# Patient Record
Sex: Female | Born: 1985 | Race: White | Hispanic: No | Marital: Married | Smoking: Never smoker
Health system: Southern US, Community
[De-identification: ages and names within clinical notes are randomized; demographics above are authoritative.]

## PROBLEM LIST (undated history)

## (undated) DIAGNOSIS — M1990 Unspecified osteoarthritis, unspecified site: Secondary | ICD-10-CM

## (undated) HISTORY — PX: APPENDECTOMY: SHX54

## (undated) HISTORY — PX: VULVECTOMY: SHX1086

## (undated) HISTORY — PX: BACK SURGERY: SHX140

## (undated) HISTORY — PX: CHOLECYSTECTOMY: SHX55

---

## 2009-04-04 ENCOUNTER — Emergency Department (HOSPITAL_BASED_OUTPATIENT_CLINIC_OR_DEPARTMENT_OTHER): Admission: EM | Admit: 2009-04-04 | Discharge: 2009-04-04 | Payer: Self-pay | Admitting: Emergency Medicine

## 2009-04-04 ENCOUNTER — Ambulatory Visit: Payer: Self-pay | Admitting: Radiology

## 2009-04-15 ENCOUNTER — Encounter: Admission: RE | Admit: 2009-04-15 | Discharge: 2009-04-15 | Payer: Self-pay | Admitting: Orthopaedic Surgery

## 2009-04-16 ENCOUNTER — Ambulatory Visit: Payer: Self-pay | Admitting: Diagnostic Radiology

## 2009-04-16 ENCOUNTER — Emergency Department (HOSPITAL_BASED_OUTPATIENT_CLINIC_OR_DEPARTMENT_OTHER): Admission: EM | Admit: 2009-04-16 | Discharge: 2009-04-16 | Payer: Self-pay | Admitting: Emergency Medicine

## 2009-04-27 ENCOUNTER — Emergency Department (HOSPITAL_BASED_OUTPATIENT_CLINIC_OR_DEPARTMENT_OTHER): Admission: EM | Admit: 2009-04-27 | Discharge: 2009-04-27 | Payer: Self-pay | Admitting: Emergency Medicine

## 2009-05-26 ENCOUNTER — Emergency Department (HOSPITAL_BASED_OUTPATIENT_CLINIC_OR_DEPARTMENT_OTHER): Admission: EM | Admit: 2009-05-26 | Discharge: 2009-05-27 | Payer: Self-pay | Admitting: Emergency Medicine

## 2009-07-15 ENCOUNTER — Encounter (INDEPENDENT_AMBULATORY_CARE_PROVIDER_SITE_OTHER): Payer: Self-pay | Admitting: Orthopedic Surgery

## 2009-07-15 ENCOUNTER — Inpatient Hospital Stay (HOSPITAL_COMMUNITY): Admission: RE | Admit: 2009-07-15 | Discharge: 2009-07-19 | Payer: Self-pay | Admitting: Orthopedic Surgery

## 2009-07-15 ENCOUNTER — Ambulatory Visit: Payer: Self-pay | Admitting: Vascular Surgery

## 2010-05-26 LAB — COMPREHENSIVE METABOLIC PANEL
Alkaline Phosphatase: 60 U/L (ref 39–117)
BUN: 10 mg/dL (ref 6–23)
Chloride: 107 mEq/L (ref 96–112)
Glucose, Bld: 53 mg/dL — ABNORMAL LOW (ref 70–99)
Potassium: 3.3 mEq/L — ABNORMAL LOW (ref 3.5–5.1)
Total Bilirubin: 0.5 mg/dL (ref 0.3–1.2)

## 2010-05-26 LAB — BASIC METABOLIC PANEL
BUN: 2 mg/dL — ABNORMAL LOW (ref 6–23)
BUN: 3 mg/dL — ABNORMAL LOW (ref 6–23)
CO2: 28 mEq/L (ref 19–32)
CO2: 28 mEq/L (ref 19–32)
CO2: 29 mEq/L (ref 19–32)
Calcium: 7.6 mg/dL — ABNORMAL LOW (ref 8.4–10.5)
Calcium: 7.7 mg/dL — ABNORMAL LOW (ref 8.4–10.5)
Chloride: 106 mEq/L (ref 96–112)
Creatinine, Ser: 0.45 mg/dL (ref 0.4–1.2)
GFR calc Af Amer: >60 mL/min (ref >60)
Glucose, Bld: 91 mg/dL (ref 70–99)
Potassium: 3.2 mEq/L — ABNORMAL LOW (ref 3.5–5.1)
Potassium: 3.3 mEq/L — ABNORMAL LOW (ref 3.5–5.1)
Sodium: 141 mEq/L (ref 135–145)

## 2010-05-26 LAB — URINALYSIS, ROUTINE W REFLEX MICROSCOPIC
Glucose, UA: NEGATIVE mg/dL
Ketones, ur: NEGATIVE mg/dL
Protein, ur: NEGATIVE mg/dL

## 2010-05-26 LAB — CBC
HCT: 25.2 % — ABNORMAL LOW (ref 36.0–46.0)
HCT: 28.5 % — ABNORMAL LOW (ref 36.0–46.0)
HCT: 40.9 % (ref 36.0–46.0)
Hemoglobin: 14.6 g/dL (ref 12.0–15.0)
Hemoglobin: 8.8 g/dL — ABNORMAL LOW (ref 12.0–15.0)
MCHC: 34.8 g/dL (ref 30.0–36.0)
MCHC: 34.8 g/dL (ref 30.0–36.0)
MCHC: 35.1 g/dL (ref 30.0–36.0)
MCV: 93.1 fL (ref 78.0–100.0)
MCV: 93.9 fL (ref 78.0–100.0)
Platelets: 148 10*3/uL — ABNORMAL LOW (ref 150–400)
Platelets: 150 10*3/uL (ref 150–400)
RBC: 3.21 MIL/uL — ABNORMAL LOW (ref 3.87–5.11)
RDW: 12.2 % (ref 11.5–15.5)
RDW: 12.3 % (ref 11.5–15.5)
RDW: 12.4 % (ref 11.5–15.5)
WBC: 13.6 10*3/uL — ABNORMAL HIGH (ref 4.0–10.5)
WBC: 8.1 10*3/uL (ref 4.0–10.5)

## 2010-05-26 LAB — TYPE AND SCREEN
ABO/RH(D): O NEG
Antibody Screen: NEGATIVE

## 2010-05-26 LAB — PROTIME-INR
INR: 1.04 (ref 0.00–1.49)
INR: 1.23 (ref 0.00–1.49)
Prothrombin Time: 13.5 seconds (ref 11.6–15.2)
Prothrombin Time: 15.4 seconds — ABNORMAL HIGH (ref 11.6–15.2)

## 2010-05-26 LAB — URINE MICROSCOPIC-ADD ON

## 2010-05-26 LAB — DIFFERENTIAL
Basophils Absolute: 0 10*3/uL (ref 0.0–0.1)
Basophils Relative: 1 % (ref 0–1)
Neutro Abs: 4.9 10*3/uL (ref 1.7–7.7)
Neutrophils Relative %: 61 % (ref 43–77)

## 2010-05-26 LAB — HCG, SERUM, QUALITATIVE: Preg, Serum: NEGATIVE

## 2010-05-28 LAB — CBC
HCT: 38 % (ref 36.0–46.0)
Hemoglobin: 13.5 g/dL (ref 12.0–15.0)
MCHC: 35.5 g/dL (ref 30.0–36.0)
MCV: 93.2 fL (ref 78.0–100.0)
RDW: 11.3 % — ABNORMAL LOW (ref 11.5–15.5)

## 2010-05-28 LAB — DIFFERENTIAL
Basophils Absolute: 0.2 10*3/uL — ABNORMAL HIGH (ref 0.0–0.1)
Basophils Relative: 1 % (ref 0–1)
Eosinophils Absolute: 0.1 10*3/uL (ref 0.0–0.7)
Eosinophils Relative: 1 % (ref 0–5)
Lymphocytes Relative: 41 % (ref 12–46)
Monocytes Absolute: 1 10*3/uL (ref 0.1–1.0)

## 2010-05-28 LAB — BASIC METABOLIC PANEL
CO2: 26 mEq/L (ref 19–32)
Chloride: 106 mEq/L (ref 96–112)
Glucose, Bld: 103 mg/dL — ABNORMAL HIGH (ref 70–99)
Potassium: 2.6 mEq/L — CL (ref 3.5–5.1)
Sodium: 144 mEq/L (ref 135–145)

## 2010-06-01 LAB — URINALYSIS, ROUTINE W REFLEX MICROSCOPIC
Hgb urine dipstick: NEGATIVE
Specific Gravity, Urine: 1.029 (ref 1.005–1.030)
Urobilinogen, UA: 0.2 mg/dL (ref 0.0–1.0)

## 2012-09-19 ENCOUNTER — Encounter (HOSPITAL_BASED_OUTPATIENT_CLINIC_OR_DEPARTMENT_OTHER): Payer: Self-pay

## 2012-09-19 ENCOUNTER — Emergency Department (HOSPITAL_BASED_OUTPATIENT_CLINIC_OR_DEPARTMENT_OTHER)
Admission: EM | Admit: 2012-09-19 | Discharge: 2012-09-19 | Disposition: A | Discharge status: Home / Self Care | Payer: Medicaid Other | Attending: Emergency Medicine | Admitting: Emergency Medicine

## 2012-09-19 ENCOUNTER — Emergency Department (HOSPITAL_BASED_OUTPATIENT_CLINIC_OR_DEPARTMENT_OTHER): Payer: Medicaid Other

## 2012-09-19 DIAGNOSIS — Z3202 Encounter for pregnancy test, result negative: Secondary | ICD-10-CM | POA: Insufficient documentation

## 2012-09-19 DIAGNOSIS — Y939 Activity, unspecified: Secondary | ICD-10-CM | POA: Insufficient documentation

## 2012-09-19 DIAGNOSIS — Z9089 Acquired absence of other organs: Secondary | ICD-10-CM | POA: Insufficient documentation

## 2012-09-19 DIAGNOSIS — Y929 Unspecified place or not applicable: Secondary | ICD-10-CM | POA: Insufficient documentation

## 2012-09-19 DIAGNOSIS — Z79899 Other long term (current) drug therapy: Secondary | ICD-10-CM | POA: Insufficient documentation

## 2012-09-19 DIAGNOSIS — Z87442 Personal history of urinary calculi: Secondary | ICD-10-CM | POA: Insufficient documentation

## 2012-09-19 DIAGNOSIS — T148XXA Other injury of unspecified body region, initial encounter: Secondary | ICD-10-CM

## 2012-09-19 DIAGNOSIS — Z8739 Personal history of other diseases of the musculoskeletal system and connective tissue: Secondary | ICD-10-CM | POA: Insufficient documentation

## 2012-09-19 DIAGNOSIS — S335XXA Sprain of ligaments of lumbar spine, initial encounter: Secondary | ICD-10-CM | POA: Insufficient documentation

## 2012-09-19 DIAGNOSIS — Z9889 Other specified postprocedural states: Secondary | ICD-10-CM | POA: Insufficient documentation

## 2012-09-19 DIAGNOSIS — X58XXXA Exposure to other specified factors, initial encounter: Secondary | ICD-10-CM | POA: Insufficient documentation

## 2012-09-19 HISTORY — DX: Unspecified osteoarthritis, unspecified site: M19.90

## 2012-09-19 LAB — URINALYSIS, ROUTINE W REFLEX MICROSCOPIC
Glucose, UA: NEGATIVE mg/dL
Hgb urine dipstick: NEGATIVE
Protein, ur: NEGATIVE mg/dL

## 2012-09-19 LAB — PREGNANCY, URINE: Preg Test, Ur: NEGATIVE

## 2012-09-19 MED ORDER — CYCLOBENZAPRINE HCL 10 MG PO TABS
10.0000 mg | ORAL_TABLET | Freq: Once | ORAL | Status: AC
  Administered 2012-09-19: 10 mg via ORAL
  Filled 2012-09-19: qty 1

## 2012-09-19 MED ORDER — OXYCODONE-ACETAMINOPHEN 5-325 MG PO TABS
1.0000 | ORAL_TABLET | Freq: Once | ORAL | Status: AC
  Administered 2012-09-19: 1 via ORAL
  Filled 2012-09-19 (×2): qty 1

## 2012-09-19 MED ORDER — HYDROCODONE-ACETAMINOPHEN 5-325 MG PO TABS
1.0000 | ORAL_TABLET | Freq: Once | ORAL | Status: AC
  Administered 2012-09-19: 1 via ORAL
  Filled 2012-09-19: qty 1

## 2012-09-19 MED ORDER — OXYCODONE-ACETAMINOPHEN 5-325 MG PO TABS: 1.0000 | ORAL_TABLET | Freq: Four times a day (QID) | ORAL | Status: DC | PRN

## 2012-09-19 MED ORDER — CYCLOBENZAPRINE HCL 10 MG PO TABS: 10.0000 mg | ORAL_TABLET | Freq: Two times a day (BID) | ORAL | Status: AC | PRN

## 2012-09-19 NOTE — ED Provider Notes (Signed)
History    CSN: [id number] Arrival date & time 09/19/12  2051  First MD Initiated Contact with Patient 09/19/12 2125     Chief Complaint  Patient presents with  . Flank Pain   (Consider location/radiation/quality/duration/timing/severity/associated sxs/prior Treatment) Patient is a 27 y.o. female presenting with flank pain. The history is provided by the patient.  Flank Pain This is a new problem. Episode onset: 3 days ago. The problem occurs hourly. The problem has not changed since onset.Pertinent negatives include no chest pain, no abdominal pain, no headaches and no shortness of breath. The symptoms are aggravated by bending and twisting (Seems to be worse at the end of the day). The symptoms are relieved by rest. She has tried a warm compress for the symptoms. The treatment provided mild relief.   Past Medical History  Diagnosis Date  . DJD (degenerative joint disease)    Past Surgical History  Procedure Laterality Date  . Appendectomy    . Cholecystectomy    . Back surgery    . Cesarean section     No family history on file. History  Substance Use Topics  . Smoking status: Never Smoker   . Smokeless tobacco: Not on file  . Alcohol Use: No   OB History   Grav Para Term Preterm Abortions TAB SAB Ect Mult Living                 Review of Systems  Respiratory: Negative for shortness of breath.   Cardiovascular: Negative for chest pain.  Gastrointestinal: Negative for abdominal pain.  Genitourinary: Positive for flank pain. Negative for dysuria, urgency, hematuria, vaginal bleeding, vaginal discharge and vaginal pain.  Neurological: Negative for headaches.  All other systems reviewed and are negative.    Allergies  Demerol and Morphine and related  Home Medications   Current Outpatient Rx  Name  Route  Sig  Dispense  Refill  . hydrOXYzine (VISTARIL) 50 MG capsule   Oral   Take 50 mg by mouth 3 (three) times daily as needed for itching.         .  lamoTRIgine (LAMICTAL) 200 MG tablet   Oral   Take 200 mg by mouth daily.         Marland Kitchen lurasidone (LATUDA) 80 MG TABS   Oral   Take by mouth daily with breakfast.         . cyclobenzaprine (FLEXERIL) 10 MG tablet   Oral   Take 1 tablet (10 mg total) by mouth 2 (two) times daily as needed for muscle spasms.   20 tablet   0   . oxyCODONE-acetaminophen (PERCOCET/ROXICET) 5-325 MG per tablet   Oral   Take 1 tablet by mouth every 6 (six) hours as needed for pain.   20 tablet   0    BP 109/65  Pulse 78  Temp(Src) 98.1 F (36.7 C) (Oral)  Resp 20  Ht 5\' 5"  (1.651 m)  Wt 174 lb (78.926 kg)  BMI 28.96 kg/m2  SpO2 100%  LMP 08/29/2012 Physical Exam  Nursing note and vitals reviewed. Constitutional: She is oriented to person, place, and time. She appears well-developed and well-nourished. No distress.  HENT:  Head: Normocephalic and atraumatic.  Eyes: EOM are normal. Pupils are equal, round, and reactive to light.  Cardiovascular: Normal rate, regular rhythm, normal heart sounds and intact distal pulses.  Exam reveals no friction rub.   No murmur heard. Pulmonary/Chest: Effort normal and breath sounds normal. She has no wheezes.  She has no rales.  Abdominal: Soft. Bowel sounds are normal. She exhibits no distension. There is no tenderness. There is no rebound and no guarding.  Musculoskeletal: Normal range of motion. She exhibits no tenderness.       Lumbar back: She exhibits pain and spasm. She exhibits normal pulse.       Back:  No edema  Neurological: She is alert and oriented to person, place, and time. No cranial nerve deficit.  Skin: Skin is warm and dry. No rash noted.  Psychiatric: She has a normal mood and affect. Her behavior is normal.    ED Course  Procedures (including critical care time) Labs Reviewed  URINALYSIS, ROUTINE W REFLEX MICROSCOPIC  PREGNANCY, URINE   Dg Lumbar Spine Complete  09/19/2012   *RADIOLOGY REPORT*  Clinical Data: Right low back pain,  prior surgery  LUMBAR SPINE - COMPLETE 4+ VIEW  Comparison: 07/15/2009  Findings: Postop changes at L4-5 and L5 S1.  Normal alignment. Preserved vertebral body heights.  No definite hardware abnormality.  Prior cholecystectomy evident.  Nonobstructive bowel gas pattern.  IMPRESSION: Postop changes L4-5 and L5 S1.  No acute process by plain radiography   Original Report Authenticated By: Judie Petit. Shick, M.D.   1. Musculoskeletal strain     MDM   Patient with right-sided back pain and symptoms are most suggestive of spasm. She has a history of spinal fusion several years ago after degenerative disc disease. She denies any urinary symptoms and denies any vaginal symptoms. Review a is completely normal and UPT is negative. She does have a history of kidney stones but states this feels different. Also with several days of pain and no blood in the urine a low suspicion for kidney stone at this time. On exam there is a palpable muscle spasm which is tender. Lumbar films are negative for displaced hardware or acute process. Patient is feeling better after pain medication and muscle relaxers. Patient recommended following up with specialist if symptoms persist. She was given prescriptions to help with pain and spasm.  Gwyneth Sprout, MD 09/19/12 [phone]

## 2012-09-19 NOTE — ED Notes (Signed)
Right side flank pain x 3 days-denies n/v/d,dysuria-positive urinary freq

## 2013-04-01 ENCOUNTER — Encounter (HOSPITAL_BASED_OUTPATIENT_CLINIC_OR_DEPARTMENT_OTHER): Payer: Self-pay | Admitting: Emergency Medicine

## 2013-04-01 ENCOUNTER — Emergency Department (HOSPITAL_BASED_OUTPATIENT_CLINIC_OR_DEPARTMENT_OTHER)
Admission: EM | Admit: 2013-04-01 | Discharge: 2013-04-02 | Disposition: A | Discharge status: Home / Self Care | Payer: Medicaid Other | Attending: Emergency Medicine | Admitting: Emergency Medicine

## 2013-04-01 DIAGNOSIS — H6690 Otitis media, unspecified, unspecified ear: Secondary | ICD-10-CM | POA: Insufficient documentation

## 2013-04-01 DIAGNOSIS — Z8739 Personal history of other diseases of the musculoskeletal system and connective tissue: Secondary | ICD-10-CM | POA: Insufficient documentation

## 2013-04-01 DIAGNOSIS — R6884 Jaw pain: Secondary | ICD-10-CM

## 2013-04-01 DIAGNOSIS — Z79899 Other long term (current) drug therapy: Secondary | ICD-10-CM | POA: Insufficient documentation

## 2013-04-01 DIAGNOSIS — R599 Enlarged lymph nodes, unspecified: Secondary | ICD-10-CM | POA: Insufficient documentation

## 2013-04-01 DIAGNOSIS — K029 Dental caries, unspecified: Secondary | ICD-10-CM | POA: Insufficient documentation

## 2013-04-01 MED ORDER — CLINDAMYCIN HCL 300 MG PO CAPS: 300.0000 mg | ORAL_CAPSULE | Freq: Four times a day (QID) | ORAL | Status: AC

## 2013-04-01 MED ORDER — OXYCODONE-ACETAMINOPHEN 5-325 MG PO TABS: 2.0000 | ORAL_TABLET | ORAL | Status: AC | PRN

## 2013-04-01 MED ORDER — OXYCODONE-ACETAMINOPHEN 5-325 MG PO TABS
2.0000 | ORAL_TABLET | Freq: Once | ORAL | Status: AC
  Administered 2013-04-02: 2 via ORAL
  Filled 2013-04-01: qty 2

## 2013-04-01 NOTE — Discharge Instructions (Signed)
Clindamycin as prescribed.  Percocet as prescribed as needed for pain.  If you're not improving in the next several days, you should followup with a dentist for further evaluation.    Emergency Department Resource Guide 1) Find a Doctor and Pay Out of Pocket Although you won't have to find out who is covered by your insurance plan, it is a good idea to ask around and get recommendations. You will then need to call the office and see if the doctor you have chosen will accept you as a new patient and what types of options they offer for patients who are self-pay. Some doctors offer discounts or will set up payment plans for their patients who do not have insurance, but you will need to ask so you aren't surprised when you get to your appointment.  2) Contact Your Local Health Department Not all health departments have doctors that can see patients for sick visits, but many do, so it is worth a call to see if yours does. If you don't know where your local health department is, you can check in your phone book. The CDC also has a tool to help you locate your state's health department, and many state websites also have listings of all of their local health departments.  3) Find a Walk-in Clinic If your illness is not likely to be very severe or complicated, you may want to try a walk in clinic. These are popping up all over the country in pharmacies, drugstores, and shopping centers. They're usually staffed by nurse practitioners or physician assistants that have been trained to treat common illnesses and complaints. They're usually fairly quick and inexpensive. However, if you have serious medical issues or chronic medical problems, these are probably not your best option.  No Primary Care Doctor: - Call Health Connect at  [phone] - they can help you locate a primary care doctor that  accepts your insurance, provides certain services, etc. - Physician Referral Service- [phone]  Chronic Pain  Problems: Organization         Address  Phone   Notes  [facility]  [phone] Patients need to be referred by their primary care doctor.   Medication Assistance: Organization         Address  Phone   Notes  [facility] [address] [place], [place] [address] [phone] --Must be a resident of [facility] [place] -- Must have NO insurance coverage whatsoever (no Medicaid/ Medicare, etc.) -- The pt. MUST have a primary care doctor that directs their care regularly and follows them in the community   Med[facility]ist  [phone]   [employer]  [phone]    Agencies that provide inexpensive medical care: Organization         Address  Phone   Notes  [facility]  [phone]   [facility]    [phone]   [facility] [address] [place], [place] [address] [phone]) [phone]   [facility] [place] [address], [place] [phone]   [facility]anned [facility]    [phone]   [facility]    [phone]   [facility]  [address],  Phone:  [phone], Fax:  [phone] Hours of Operation:  9 am - 6 pm, M-F.  Also accepts Medicaid/Medicare and self-pay.  [facility]  [address], [place] Phone: [phone], Fax: [phone]. Hours of Operation:  8:30 am - 5:30 pm, M-F.  Also accepts Medicaid and self-pay.  °[facility] [place] [address], [place] Phone: [phone]   °[facility] [address], [place], [place] [phone], Ext. 123 Mondays & Thursdays: 7-9 AM.  First 15 patients are seen on a first come, first serve basis. °  ° °Medicaid-accepting [place] Providers: ° °Organization         Address  Phone   Notes  °[facility] [address], [place] [phone] Also  accepts self-pay patients.  °[facility] [address], [place] ° [phone]   °[facility] [address], [place] [phone]   °[facility] [address] [place] [address], [place] [phone]   °Veita Bland [address], [place]  ° [phone] Only accepts [place] Access Medicaid patients after they have their name applied to their card.  ° °Self-Pay (no insurance) in [place]: ° °Organization         Address  Phone   Notes  °Sickle Cell Patients, [facility] [address], [place] [phone]   °[facility] [address], [place] [phone]   °[facility] ° [address], [place] [phone]   °[facility]/Dr. Osei-Bonsu ° [address] [place] [address], [place] or [address], [place] [phone] Phone number for both [place] and [place] locations is the same.  °[facility] [address], [place] [phone]   °[facility] [address] [place] [address], [place] or [address] [phone] °([phone]) [phone]   °[facility] [address], [place] [phone]) [phone], phone; [phone], fax Sees patients 1st and 3rd Saturday of every month.  Must not qualify for public or private insurance (i.e. Medicaid, Medicare, [place] Health Choice, Veterans' Benefits) • Household income should be no more than 200% of the poverty level •The clinic cannot treat you if you are pregnant or think you are pregnant • Sexually transmitted diseases are not treated at the clinic.  ° ° °Dental Care: °Organization         Address  Phone  Notes  °[place] [facility] [address], [place] [phone]) [phone] Accepts children up to age 21 who are enrolled in Medicaid or [place] Health Choice; pregnant  women with a Medicaid card; and children who have applied for Medicaid or [place] Health Choice, but were declined, whose parents can pay a reduced fee at time of service.  °[place] [facility] [place]  [address], [place] [phone]) [phone] Accepts children up to age 21 who are enrolled in Medicaid or [place] Health Choice; pregnant women with a Medicaid card; and children who have applied for Medicaid or [place] Health Choice, but were declined, whose parents can pay a reduced fee at time of service.  °[facility] ° [address], [place] [phone]) [phone] Patients are seen by appointment only. Walk-ins are not accepted. [facility] will see patients 18 years of age and older. °Monday - Tuesday (8am-5pm) °Most Wednesdays (8:30-5pm) °$30 per visit, cash only  °[facility]   [facility]  [address], [facility] [phone] Patients are seen by appointment only. Walk-ins are not accepted. [place] will see patients 37 years of age and older. One Wednesday Evening (Monthly: Volunteer Based).  $30 per visit, cash only  [place]  [phone] for adults; Children under age 77, call Graduate Pediatric Dentistry at [phone]. Children aged 79-14, please call [phone] to request a pediatric application.  Dental services are provided in all areas of dental care including fillings, crowns and bridges, complete and partial dentures, implants, gum treatment, root canals, and extractions. Preventive care is also provided. Treatment is provided to both adults and children. Patients are selected via a lottery and there is often a waiting list.   [facility] [address], [place]  [phone] [email or web address]   [facility] [address] [place], [place] [phone], Ext. [phone] Second and Fourth Thursday of each month, opens at 6:30 AM; Clinic ends at 9 AM.  Patients are  seen on a first-come first-served basis, and a limited number are seen during each clinic.   [facility]  [address] Hillard Danker [place], [place] [phone]   Eligibility Requirements You must have lived in [place], [place], or [place] counties for at least the last three months.   You cannot be eligible for state or federal sponsored [employer], including [employer], [place], or [employer].   You generally cannot be eligible for healthcare insurance through your employer.    How to apply: Eligibility screenings are held every Tuesday and Wednesday afternoon from 1:00 pm until 4:00 pm. You do not need an appointment for the interview!  [facility] [address], [place], [place]   [place] Department  [place]  [phone]    Behavioral Health Resources in the Community: Intensive Outpatient Programs Organization         Address  Phone  Notes  [place]. [address] [place], [place] [phone]   [facility] [address], [place], [place]   [facility] [address] [place], [place]   [place] [address],  [place], [place] or [phone]   Substance Abuse Resources Organization         Address  Phone  Notes  [facility] Drug Services  [phone]   [employer]  [phone]   [facility] [place]   Chinita Pester  [phone]   Residential & Outpatient Substance Abuse Program  [phone]   Psychological Services Organization         Address  Phone  Notes  [facility] [place]  [place]  [phone]   [place] [address] [place] [phone] or [phone]    Mobile Crisis  Teams Organization         Address  Phone  Notes  [facility], Mobile Crisis Care Unit  [phone]   [facility]  [address] [place], [place]   [place] [address], [place] [phone]    Self-Help/Support Groups Organization         Address  Phone             Notes  Mental Health  [place] - variety of support groups  336- H3156881 Call for more information  Narcotics Anonymous (NA), [facility] [address], [employer] [place]  2 meetings at this location   Special educational needs teacher         Address  Phone  Notes  [facility] [place],    [place]  [phone])[phone]   [facility]  [address], [place] [address]1157, [place], [place]   [place], [place] [phone] Admissions: 8am-3pm M-F  Incentives Substance [place] [address],    [place], [place] [phone]   [facility] [address], [place], [place]   [facility] [address],  [place], [place]   [facility] - Intensive Outpatient [address], [place], [place]   [facility] ([place]  [place], [place] [phone]-(820)[phone] or [phone]   [facility] ([facility]) [address] [place], [place] Accepts Medicaid  [facility] [address],  [place] [phone] Substance Abuse/Addiction Treatment   [facility] Organization         Address  Phone  Notes  [facility]  [phone]   Domenic Schwab, PhD [address] [place], [place]   [phone] or 7720305616   [place], [place] [phone]   [facility] [address], [place], [place] [phone]  Insurance/Medicaid/sponsorship through [facility] and Families [address], [address] [place]                                    [place], [place] [phone] [employer] [address], [place] [phone]    Dr. Adele Schilder  [phone]   [facility] Dept. 1) [address] [place] 2) [place] 3)  [place] [address], [place] [phone]  [phone]   [place] [phone] or [phone] (After Hours)

## 2013-04-01 NOTE — ED Provider Notes (Signed)
CSN: [id number]     Arrival date & time 04/01/13  2318 History   First MD Initiated Contact with Patient 04/01/13 2329     Chief Complaint  Patient presents with  . Otitis Media   (Consider location/radiation/quality/duration/timing/severity/associated sxs/prior Treatment) HPI Comments: Patient is a 28 year old female who presents with complaints of left jaw pain. She states she was treated for an otitis media 2 weeks ago with amoxicillin however did not improve. She feels like the lymph nodes in her neck are swollen. She reports severe pain with palpation. Her pain is also exacerbated by eating and chewing.  The history is provided by the patient.    Past Medical History  Diagnosis Date  . DJD (degenerative joint disease)    Past Surgical History  Procedure Laterality Date  . Appendectomy    . Cholecystectomy    . Back surgery    . Cesarean section     History reviewed. No pertinent family history. History  Substance Use Topics  . Smoking status: Never Smoker   . Smokeless tobacco: Not on file  . Alcohol Use: No   OB History   Grav Para Term Preterm Abortions TAB SAB Ect Mult Living                 Review of Systems  All other systems reviewed and are negative.    Allergies  Demerol and Morphine and related  Home Medications   Current Outpatient Rx  Name  Route  Sig  Dispense  Refill  . pregabalin (LYRICA) 50 MG capsule   Oral   Take 50 mg by mouth 3 (three) times daily.         . cyclobenzaprine (FLEXERIL) 10 MG tablet   Oral   Take 1 tablet (10 mg total) by mouth 2 (two) times daily as needed for muscle spasms.   20 tablet   0   . hydrOXYzine (VISTARIL) 50 MG capsule   Oral   Take 50 mg by mouth 3 (three) times daily as needed for itching.         . lurasidone (LATUDA) 80 MG TABS   Oral   Take by mouth daily with breakfast.          BP 117/69  Pulse 95  Temp(Src) 97.9 F (36.6 C) (Oral)  Resp 23  Ht 5\' 5"  (1.651 m)  Wt 185 lb (83.915  kg)  BMI 30.79 kg/m2  SpO2 100% Physical Exam  Nursing note and vitals reviewed. Constitutional: She is oriented to person, place, and time. She appears well-developed and well-nourished. No distress.  HENT:  Head: Normocephalic and atraumatic.  There is tenderness to palpation of the left jaw and the left lower molars. There are some caries however no obvious swelling consistent with an abscess.  Neck: Normal range of motion. Neck supple.  Musculoskeletal: Normal range of motion. She exhibits no edema.  Lymphadenopathy:    She has no cervical adenopathy.  Neurological: She is alert and oriented to person, place, and time.  Skin: Skin is warm and dry. She is not diaphoretic.    ED Course  Procedures (including critical care time) Labs Review Labs Reviewed - No data to display Imaging Review No results found.    MDM  No diagnosis found. Her discomfort seems dental in nature. I will prescribe an antibiotic and pain medication. I've advised her to followup with her dentist if not improving in the next several days.    Geoffery Lyonsouglas Shiya Fogelman, MD 04/01/13 [phone]

## 2013-04-01 NOTE — ED Notes (Signed)
Pt reports that she was treated in the ED for an ear infection 2 weeks ago.  Reports that the pain continues.  States that the infection is in her head and mouth now.

## 2013-06-07 ENCOUNTER — Encounter (HOSPITAL_BASED_OUTPATIENT_CLINIC_OR_DEPARTMENT_OTHER): Payer: Self-pay | Admitting: Emergency Medicine

## 2013-06-07 ENCOUNTER — Emergency Department (HOSPITAL_BASED_OUTPATIENT_CLINIC_OR_DEPARTMENT_OTHER): Payer: Medicaid Other

## 2013-06-07 ENCOUNTER — Emergency Department (HOSPITAL_BASED_OUTPATIENT_CLINIC_OR_DEPARTMENT_OTHER)
Admission: EM | Admit: 2013-06-07 | Discharge: 2013-06-07 | Disposition: A | Discharge status: Home / Self Care | Payer: Medicaid Other | Attending: Emergency Medicine | Admitting: Emergency Medicine

## 2013-06-07 DIAGNOSIS — R109 Unspecified abdominal pain: Secondary | ICD-10-CM | POA: Insufficient documentation

## 2013-06-07 DIAGNOSIS — Z79899 Other long term (current) drug therapy: Secondary | ICD-10-CM | POA: Insufficient documentation

## 2013-06-07 DIAGNOSIS — R1030 Lower abdominal pain, unspecified: Secondary | ICD-10-CM

## 2013-06-07 DIAGNOSIS — M1990 Unspecified osteoarthritis, unspecified site: Secondary | ICD-10-CM | POA: Insufficient documentation

## 2013-06-07 DIAGNOSIS — Z3202 Encounter for pregnancy test, result negative: Secondary | ICD-10-CM | POA: Insufficient documentation

## 2013-06-07 DIAGNOSIS — Z792 Long term (current) use of antibiotics: Secondary | ICD-10-CM | POA: Insufficient documentation

## 2013-06-07 DIAGNOSIS — Z9089 Acquired absence of other organs: Secondary | ICD-10-CM | POA: Insufficient documentation

## 2013-06-07 LAB — URINALYSIS, ROUTINE W REFLEX MICROSCOPIC
Bilirubin Urine: NEGATIVE
GLUCOSE, UA: NEGATIVE mg/dL
HGB URINE DIPSTICK: NEGATIVE
KETONES UR: NEGATIVE mg/dL
Leukocytes, UA: NEGATIVE
Nitrite: NEGATIVE
PROTEIN: NEGATIVE mg/dL
Specific Gravity, Urine: 1.025 (ref 1.005–1.030)
UROBILINOGEN UA: 0.2 mg/dL (ref 0.0–1.0)
pH: 6 (ref 5.0–8.0)

## 2013-06-07 LAB — PREGNANCY, URINE: Preg Test, Ur: NEGATIVE

## 2013-06-07 MED ORDER — TRAMADOL HCL 50 MG PO TABS: 50.0000 mg | ORAL_TABLET | Freq: Four times a day (QID) | ORAL | Status: AC | PRN

## 2013-06-07 MED ORDER — OXYCODONE-ACETAMINOPHEN 5-325 MG PO TABS
1.0000 | ORAL_TABLET | Freq: Once | ORAL | Status: AC
  Administered 2013-06-07: 1 via ORAL

## 2013-06-07 MED ORDER — OXYCODONE-ACETAMINOPHEN 5-325 MG PO TABS
ORAL_TABLET | ORAL | Status: AC
  Filled 2013-06-07: qty 1

## 2013-06-07 MED ORDER — NAPROXEN 375 MG PO TABS: 375.0000 mg | ORAL_TABLET | Freq: Two times a day (BID) | ORAL | Status: DC

## 2013-06-07 MED ORDER — IOHEXOL 300 MG/ML  SOLN
100.0000 mL | Freq: Once | INTRAMUSCULAR | Status: AC | PRN
  Administered 2013-06-07: 100 mL via INTRAVENOUS

## 2013-06-07 NOTE — ED Provider Notes (Signed)
CSN: [id number]     Arrival date & time 06/07/13  2005 History  This chart was scribed for Rolan BuccoMelanie Deannah Rossi, MD by Smiley HousemanFallon Davis, ED Scribe. The patient was seen in room MH04/MH04. Patient's care was started at 8:52 PM.    Chief Complaint  Patient presents with  . Vaginal Pain   The history is provided by the patient. No language interpreter was used.   HPI Comments: Whitney Ayers is a 28 y.o. female who presents to the Emergency Department complaining of intermittent external vaginal pain that started about 2 weeks ago.  Pt states that the pain has rapidly worsened over the past two days.  She states she thought she pulled a groin muscle, because she isn't experiencing internal vaginal pain.  Pt states the area is tender to touch.  Pt denies abdominal pain, but states her lower abdomen is sore due to the pain.  Pt's husband states she has dropped to the floor a couple of times due to the pain.  Pt denies any nausea, emesis, vaginal discharge, dysuria, and hematuria.  Pt states she had a vulvectomy in 2007 and thought she had nerve damage.  Pt states she had the vulvectomy because she had excessive tissue.  She describes it as a shooting pain that comes and goes.  Past Medical History  Diagnosis Date  . DJD (degenerative joint disease)    Past Surgical History  Procedure Laterality Date  . Appendectomy    . Cholecystectomy    . Back surgery    . Cesarean section    . Vulvectomy     No family history on file. History  Substance Use Topics  . Smoking status: Never Smoker   . Smokeless tobacco: Not on file  . Alcohol Use: No   OB History   Grav Para Term Preterm Abortions TAB SAB Ect Mult Living                 Review of Systems  Constitutional: Negative for fever and chills.  Gastrointestinal: Negative for nausea, vomiting, abdominal pain and diarrhea.  Genitourinary: Positive for vaginal pain. Negative for dysuria, urgency, hematuria, vaginal bleeding, vaginal discharge and  difficulty urinating.  Musculoskeletal: Negative for back pain.  Skin: Negative for color change and rash.  Neurological: Negative for headaches.  Psychiatric/Behavioral: Negative for behavioral problems and confusion.  All other systems reviewed and are negative.    Allergies  Demerol and Morphine and related  Home Medications   Current Outpatient Rx  Name  Route  Sig  Dispense  Refill  . clindamycin (CLEOCIN) 300 MG capsule   Oral   Take 1 capsule (300 mg total) by mouth 4 (four) times daily. X 7 days   28 capsule   0   . cyclobenzaprine (FLEXERIL) 10 MG tablet   Oral   Take 1 tablet (10 mg total) by mouth 2 (two) times daily as needed for muscle spasms.   20 tablet   0   . hydrOXYzine (VISTARIL) 50 MG capsule   Oral   Take 50 mg by mouth 3 (three) times daily as needed for itching.         . lurasidone (LATUDA) 80 MG TABS   Oral   Take by mouth daily with breakfast.         . naproxen (NAPROSYN) 375 MG tablet   Oral   Take 1 tablet (375 mg total) by mouth 2 (two) times daily.   20 tablet   0   .  oxyCODONE-acetaminophen (PERCOCET) 5-325 MG per tablet   Oral   Take 2 tablets by mouth every 4 (four) hours as needed.   15 tablet   0   . pregabalin (LYRICA) 50 MG capsule   Oral   Take 50 mg by mouth 3 (three) times daily.         . traMADol (ULTRAM) 50 MG tablet   Oral   Take 1 tablet (50 mg total) by mouth every 6 (six) hours as needed.   15 tablet   0    Triage Vitals: BP 113/76  Pulse 75  Temp(Src) 98.5 F (36.9 C) (Oral)  Resp 16  Ht 5\' 5"  (1.651 m)  Wt 180 lb (81.647 kg)  BMI 29.95 kg/m2  SpO2 100%  LMP 05/22/2013  Physical Exam  Nursing note and vitals reviewed. Constitutional: She is oriented to person, place, and time. She appears well-developed and well-nourished. No distress.  HENT:  Head: Normocephalic and atraumatic.  Eyes: Conjunctivae and EOM are normal. Right eye exhibits no discharge. Left eye exhibits no discharge.   Neck: Neck supple. No tracheal deviation present.  Cardiovascular: Normal rate.   Pulmonary/Chest: Effort normal. No respiratory distress.  Abdominal: Soft. She exhibits no distension and no mass. There is no tenderness. There is no rebound and no guarding.  Genitourinary:  +TTP left inguinal area.  No mass/swelling.  No pain to vaginal area/introitus.  No rashes or sores  Musculoskeletal: Normal range of motion.  Neurological: She is alert and oriented to person, place, and time.  Skin: Skin is warm and dry. No rash noted.  Psychiatric: She has a normal mood and affect. Her behavior is normal. Judgment and thought content normal.    ED Course  Procedures (including critical care time) DIAGNOSTIC STUDIES: Oxygen Saturation is 100% on RA, normal by my interpretation.    COORDINATION OF CARE: 9:01 PM-Will complete a pelvic exam. Will order CT of pelvis.  Patient informed of current plan of treatment and evaluation and agrees with plan.    Results for orders placed during the hospital encounter of 06/07/13  URINALYSIS, ROUTINE W REFLEX MICROSCOPIC      Result Value Ref Range   Color, Urine YELLOW  YELLOW   APPearance CLOUDY (*) CLEAR   Specific Gravity, Urine 1.025  1.005 - 1.030   pH 6.0  5.0 - 8.0   Glucose, UA NEGATIVE  NEGATIVE mg/dL   Hgb urine dipstick NEGATIVE  NEGATIVE   Bilirubin Urine NEGATIVE  NEGATIVE   Ketones, ur NEGATIVE  NEGATIVE mg/dL   Protein, ur NEGATIVE  NEGATIVE mg/dL   Urobilinogen, UA 0.2  0.0 - 1.0 mg/dL   Nitrite NEGATIVE  NEGATIVE   Leukocytes, UA NEGATIVE  NEGATIVE  PREGNANCY, URINE      Result Value Ref Range   Preg Test, Ur NEGATIVE  NEGATIVE    Imaging Review Ct Pelvis W Contrast  06/07/2013   CLINICAL DATA:  Left groin pain.  EXAM: CT PELVIS WITH CONTRAST  TECHNIQUE: Multidetector CT imaging of the pelvis was performed using the standard protocol following the bolus administration of intravenous contrast.  CONTRAST:  OMNIPAQUE IOHEXOL 300  MG/ML  SOLN  COMPARISON:  CT of the abdomen and pelvis performed 01/30/2003  FINDINGS: No focal abnormalities are seen to explain the patient's symptoms. The inguinal regions are unremarkable bilaterally. There is no evidence of inguinal lymphadenopathy. There is no evidence of an inguinal hernia. Underlying vasculature is within normal limits.  There is no evidence of fracture  or dislocation. Both hip joints are grossly unremarkable in appearance. No significant degenerative change is seen. The patient is status post lumbar spinal fusion at L4-S1, incompletely imaged on this study, with prominent associated metallic spacers. The sacroiliac joints are unremarkable in appearance.  The uterus is within normal limits. The ovaries are relatively symmetric. No suspicious adnexal masses are seen. The patient is status post appendectomy. The visualized small and large bowel are grossly unremarkable in appearance.  IMPRESSION: Unremarkable CT of the pelvis.   Electronically Signed   By: Roanna Raider M.D.   On: 06/07/2013 22:41    MDM   Final diagnoses:  Groin pain    PT with pain to left groin.  No hernia palpated.  No abd pain/flank pain or hematuria that would be more suggestive of renal colic.  No vaginal symptoms.  Could be nerve irritation/impingement from scar tissue.  Referred to fl/u with ob/gyn.  Discharged with pain meds.  I personally performed the services described in this documentation, which was scribed in my presence.  The recorded information has been reviewed and considered.      Rolan Bucco, MD 06/07/13 [phone]

## 2013-06-07 NOTE — Discharge Instructions (Signed)
Pelvic Pain, Female °Female pelvic pain can be caused by many different things and start from a variety of places. Pelvic pain refers to pain that is located in the lower half of the abdomen and between your hips. The pain may occur over a short period of time (acute) or may be reoccurring (chronic). The cause of pelvic pain may be related to disorders affecting the female reproductive organs (gynecologic), but it may also be related to the bladder, kidney stones, an intestinal complication, or muscle or skeletal problems. Getting help right away for pelvic pain is important, especially if there has been severe, sharp, or a sudden onset of unusual pain. It is also important to get help right away because some types of pelvic pain can be life threatening.  °CAUSES  °Below are only some of the causes of pelvic pain. The causes of pelvic pain can be in one of several categories.  °· Gynecologic. °· Pelvic inflammatory disease. °· Sexually transmitted infection. °· Ovarian cyst or a twisted ovarian ligament (ovarian torsion). °· Uterine lining that grows outside the uterus (endometriosis). °· Fibroids, cysts, or tumors. °· Ovulation. °· Pregnancy. °· Pregnancy that occurs outside the uterus (ectopic pregnancy). °· Miscarriage. °· Labor. °· Abruption of the placenta or ruptured uterus. °· Infection. °· Uterine infection (endometritis). °· Bladder infection. °· Diverticulitis. °· Miscarriage related to a uterine infection (septic abortion). °· Bladder. °· Inflammation of the bladder (cystitis). °· Kidney stone(s). °· Gastrointenstinal. °· Constipation. °· Diverticulitis. °· Neurologic. °· Trauma. °· Feeling pelvic pain because of mental or emotional causes (psychosomatic). °· Cancers of the bowel or pelvis. °EVALUATION  °Your caregiver will want to take a careful history of your concerns. This includes recent changes in your health, a careful gynecologic history of your periods (menses), and a sexual history. Obtaining  your family history and medical history is also important. Your caregiver may suggest a pelvic exam. A pelvic exam will help identify the location and severity of the pain. It also helps in the evaluation of which organ system may be involved. In order to identify the cause of the pelvic pain and be properly treated, your caregiver may order tests. These tests may include:  °· A pregnancy test. °· Pelvic ultrasonography. °· An X-ray exam of the abdomen. °· A urinalysis or evaluation of vaginal discharge. °· Blood tests. °HOME CARE INSTRUCTIONS  °· Only take over-the-counter or prescription medicines for pain, discomfort, or fever as directed by your caregiver.   °· Rest as directed by your caregiver.   °· Eat a balanced diet.   °· Drink enough fluids to make your urine clear or pale yellow, or as directed.   °· Avoid sexual intercourse if it causes pain.   °· Apply warm or cold compresses to the lower abdomen depending on which one helps the pain.   °· Avoid stressful situations.   °· Keep a journal of your pelvic pain. Write down when it started, where the pain is located, and if there are things that seem to be associated with the pain, such as food or your menstrual cycle. °· Follow up with your caregiver as directed.   °SEEK MEDICAL CARE IF: °· Your medicine does not help your pain. °· You have abnormal vaginal discharge. °SEEK IMMEDIATE MEDICAL CARE IF:  °· You have heavy bleeding from the vagina.   °· Your pelvic pain increases.   °· You feel lightheaded or faint.   °· You have chills.   °· You have pain with urination or blood in your urine.   °· You have uncontrolled   diarrhea or vomiting.   °· You have a fever or persistent symptoms for more than 3 days. °· You have a fever and your symptoms suddenly get worse.   °· You are being physically or sexually abused.   °MAKE SURE YOU: °· Understand these instructions. °· Will watch your condition. °· Will get help if you are not doing well or get worse. °Document  Released: 01/20/2004 Document Revised: 08/24/2011 Document Reviewed: 06/14/2011 °ExitCare® Patient Information ©2014 [employer]. ° °

## 2013-06-07 NOTE — ED Notes (Signed)
C/o external vaginal pain x 2 weeks -worse x 2 days

## 2014-03-03 ENCOUNTER — Emergency Department (HOSPITAL_BASED_OUTPATIENT_CLINIC_OR_DEPARTMENT_OTHER)
Admission: EM | Admit: 2014-03-03 | Discharge: 2014-03-03 | Disposition: A | Discharge status: Home / Self Care | Payer: Medicaid Other | Attending: Emergency Medicine | Admitting: Emergency Medicine

## 2014-03-03 ENCOUNTER — Encounter (HOSPITAL_BASED_OUTPATIENT_CLINIC_OR_DEPARTMENT_OTHER): Payer: Self-pay | Admitting: Emergency Medicine

## 2014-03-03 DIAGNOSIS — J069 Acute upper respiratory infection, unspecified: Secondary | ICD-10-CM | POA: Insufficient documentation

## 2014-03-03 DIAGNOSIS — J208 Acute bronchitis due to other specified organisms: Secondary | ICD-10-CM

## 2014-03-03 DIAGNOSIS — Z8739 Personal history of other diseases of the musculoskeletal system and connective tissue: Secondary | ICD-10-CM | POA: Insufficient documentation

## 2014-03-03 DIAGNOSIS — Z792 Long term (current) use of antibiotics: Secondary | ICD-10-CM | POA: Insufficient documentation

## 2014-03-03 DIAGNOSIS — Z79899 Other long term (current) drug therapy: Secondary | ICD-10-CM | POA: Insufficient documentation

## 2014-03-03 LAB — RAPID STREP SCREEN (MED CTR MEBANE ONLY): Streptococcus, Group A Screen (Direct): NEGATIVE

## 2014-03-03 MED ORDER — PREDNISONE 20 MG PO TABS: 40.0000 mg | ORAL_TABLET | Freq: Every day | ORAL | Status: AC

## 2014-03-03 MED ORDER — ALBUTEROL SULFATE HFA 108 (90 BASE) MCG/ACT IN AERS: 2.0000 | INHALATION_SPRAY | RESPIRATORY_TRACT | Status: AC | PRN

## 2014-03-03 MED ORDER — HYDROCODONE-HOMATROPINE 5-1.5 MG/5ML PO SYRP: 5.0000 mL | ORAL_SOLUTION | Freq: Four times a day (QID) | ORAL | Status: AC | PRN

## 2014-03-03 MED ORDER — BENZONATATE 100 MG PO CAPS: 100.0000 mg | ORAL_CAPSULE | Freq: Three times a day (TID) | ORAL | Status: AC

## 2014-03-03 NOTE — ED Notes (Signed)
Pt presents to ED with complaints of cough since this am and scratchy throat since Friday.

## 2014-03-03 NOTE — ED Provider Notes (Signed)
CSN: [id number]     Arrival date & time 03/03/14  [id number] History   First MD Initiated Contact with Patient 03/03/14 [phone]     Chief Complaint  Patient presents with  . Cough     (Consider location/radiation/quality/duration/timing/severity/associated sxs/prior Treatment) HPI Comments: Patient has been sick since yesterday with cough and scratchy throat. Patient reports that she was up most of the night because of the cough. She reports that her daughter was prescribed medicine for bronchitis last week and had similar symptoms. There has not been any vomiting or diarrhea. She has not documented any fever.  Patient is a 28 y.o. female presenting with cough.  Cough   Past Medical History  Diagnosis Date  . DJD (degenerative joint disease)    Past Surgical History  Procedure Laterality Date  . Appendectomy    . Cholecystectomy    . Back surgery    . Cesarean section    . Vulvectomy     No family history on file. History  Substance Use Topics  . Smoking status: Never Smoker   . Smokeless tobacco: Not on file  . Alcohol Use: No   OB History    No data available     Review of Systems  HENT: Positive for congestion.   Respiratory: Positive for cough.   All other systems reviewed and are negative.     Allergies  Demerol and Morphine and related  Home Medications   Prior to Admission medications   Medication Sig Start Date End Date Taking? Authorizing Provider  clindamycin (CLEOCIN) 300 MG capsule Take 1 capsule (300 mg total) by mouth 4 (four) times daily. X 7 days 04/01/13   Geoffery Lyonsouglas Delo, MD  cyclobenzaprine (FLEXERIL) 10 MG tablet Take 1 tablet (10 mg total) by mouth 2 (two) times daily as needed for muscle spasms. 09/19/12   Gwyneth SproutWhitney Plunkett, MD  hydrOXYzine (VISTARIL) 50 MG capsule Take 50 mg by mouth 3 (three) times daily as needed for itching.    Historical Provider, MD  lurasidone (LATUDA) 80 MG TABS Take by mouth daily with breakfast.    Historical Provider, MD   naproxen (NAPROSYN) 375 MG tablet Take 1 tablet (375 mg total) by mouth 2 (two) times daily. 06/07/13   Rolan BuccoMelanie Belfi, MD  oxyCODONE-acetaminophen (PERCOCET) 5-325 MG per tablet Take 2 tablets by mouth every 4 (four) hours as needed. 04/01/13   Geoffery Lyonsouglas Delo, MD  pregabalin (LYRICA) 50 MG capsule Take 50 mg by mouth 3 (three) times daily.    Historical Provider, MD  traMADol (ULTRAM) 50 MG tablet Take 1 tablet (50 mg total) by mouth every 6 (six) hours as needed. 06/07/13   Rolan BuccoMelanie Belfi, MD   BP 123/71 mmHg  Pulse 76  Temp(Src) 98.1 F (36.7 C) (Oral)  Resp 18  Ht 5\' 5"  (1.651 m)  Wt 175 lb (79.379 kg)  BMI 29.12 kg/m2  SpO2 99%  LMP 03/02/2014 Physical Exam  Constitutional: She is oriented to person, place, and time. She appears well-developed and well-nourished. No distress.  HENT:  Head: Normocephalic and atraumatic.  Right Ear: Hearing normal.  Left Ear: Hearing normal.  Nose: Nose normal.  Mouth/Throat: Oropharynx is clear and moist and mucous membranes are normal.  Eyes: Conjunctivae and EOM are normal. Pupils are equal, round, and reactive to light.  Neck: Normal range of motion. Neck supple.  Cardiovascular: Regular rhythm, S1 normal and S2 normal.  Exam reveals no gallop and no friction rub.   No murmur heard. Pulmonary/Chest: Effort normal  and breath sounds normal. No respiratory distress. She exhibits no tenderness.  Very slight and intermittent expiratory wheeze, normal air movement  Abdominal: Soft. Normal appearance and bowel sounds are normal. There is no hepatosplenomegaly. There is no tenderness. There is no rebound, no guarding, no tenderness at McBurney's point and negative Murphy's sign. No hernia.  Musculoskeletal: Normal range of motion.  Neurological: She is alert and oriented to person, place, and time. She has normal strength. No cranial nerve deficit or sensory deficit. Coordination normal. GCS eye subscore is 4. GCS verbal subscore is 5. GCS motor subscore is 6.   Skin: Skin is warm, dry and intact. No rash noted. No cyanosis.  Psychiatric: She has a normal mood and affect. Her speech is normal and behavior is normal. Thought content normal.  Nursing note and vitals reviewed.   ED Course  Procedures (including critical care time) Labs Review Labs Reviewed  RAPID STREP SCREEN  CULTURE, GROUP A STREP    Imaging Review No results found.   EKG Interpretation None      MDM   Final diagnoses:  None   bronchitis  Zentz to the ER for evaluation of cough. Daughter has similar symptoms. Patient does have a very slight amount of wheezing currently, but is moving air well. Normal oxygenation. No concern for pneumonia clinically. Patient will be treated symptomatically and with albuterol, prednisone.    Gilda Creasehristopher J. Sanae Willetts, MD 03/03/14 [phone]

## 2014-03-03 NOTE — Discharge Instructions (Signed)
Acute Bronchitis °Bronchitis is inflammation of the airways that extend from the windpipe into the lungs (bronchi). The inflammation often causes mucus to develop. This leads to a cough, which is the most common symptom of bronchitis.  °In acute bronchitis, the condition usually develops suddenly and goes away over time, usually in a couple weeks. Smoking, allergies, and asthma can make bronchitis worse. Repeated episodes of bronchitis may cause further lung problems.  °CAUSES °Acute bronchitis is most often caused by the same virus that causes a cold. The virus can spread from person to person (contagious) through coughing, sneezing, and touching contaminated objects. °SIGNS AND SYMPTOMS  °· Cough.   °· Fever.   °· Coughing up mucus.   °· Body aches.   °· Chest congestion.   °· Chills.   °· Shortness of breath.   °· Sore throat.   °DIAGNOSIS  °Acute bronchitis is usually diagnosed through a physical exam. Your health care provider will also ask you questions about your medical history. Tests, such as chest X-rays, are sometimes done to rule out other conditions.  °TREATMENT  °Acute bronchitis usually goes away in a couple weeks. Oftentimes, no medical treatment is necessary. Medicines are sometimes given for relief of fever or cough. Antibiotic medicines are usually not needed but may be prescribed in certain situations. In some cases, an inhaler may be recommended to help reduce shortness of breath and control the cough. A cool mist vaporizer may also be used to help thin bronchial secretions and make it easier to clear the chest.  °HOME CARE INSTRUCTIONS °· Get plenty of rest.   °· Drink enough fluids to keep your urine clear or pale yellow (unless you have a medical condition that requires fluid restriction). Increasing fluids may help thin your respiratory secretions (sputum) and reduce chest congestion, and it will prevent dehydration.   °· Take medicines only as directed by your health care provider. °· If  you were prescribed an antibiotic medicine, finish it all even if you start to feel better. °· Avoid smoking and secondhand smoke. Exposure to cigarette smoke or irritating chemicals will make bronchitis worse. If you are a smoker, consider using nicotine gum or skin patches to help control withdrawal symptoms. Quitting smoking will help your lungs heal faster.   °· Reduce the chances of another bout of acute bronchitis by washing your hands frequently, avoiding people with cold symptoms, and trying not to touch your hands to your mouth, nose, or eyes.   °· Keep all follow-up visits as directed by your health care provider.   °SEEK MEDICAL CARE IF: °Your symptoms do not improve after 1 week of treatment.  °SEEK IMMEDIATE MEDICAL CARE IF: °· You develop an increased fever or chills.   °· You have chest pain.   °· You have severe shortness of breath. °· You have bloody sputum.   °· You develop dehydration. °· You faint or repeatedly feel like you are going to pass out. °· You develop repeated vomiting. °· You develop a severe headache. °MAKE SURE YOU:  °· Understand these instructions. °· Will watch your condition. °· Will get help right away if you are not doing well or get worse. °Document Released: 04/01/2004 Document Revised: 07/09/2013 Document Reviewed: 08/15/2012 °ExitCare® Patient Information ©2015 [employer]. This information is not intended to replace advice given to you by your health care provider. Make sure you discuss any questions you have with your health care provider. ° ° °Viral Infections °A viral infection can be caused by different types of viruses. Most viral infections are not serious and resolve on their   own. However, some infections may cause severe symptoms and may lead to further complications. °SYMPTOMS °Viruses can frequently cause: °· Minor sore throat. °· Aches and pains. °· Headaches. °· Runny nose. °· Different types of rashes. °· Watery eyes. °· Tiredness. °· Cough. °· Loss of  appetite. °· Gastrointestinal infections, resulting in nausea, vomiting, and diarrhea. °These symptoms do not respond to antibiotics because the infection is not caused by bacteria. However, you might catch a bacterial infection following the viral infection. This is sometimes called a "superinfection." Symptoms of such a bacterial infection may include: °· Worsening sore throat with pus and difficulty swallowing. °· Swollen neck glands. °· Chills and a high or persistent fever. °· Severe headache. °· Tenderness over the sinuses. °· Persistent overall ill feeling (malaise), muscle aches, and tiredness (fatigue). °· Persistent cough. °· Yellow, green, or brown mucus production with coughing. °HOME CARE INSTRUCTIONS  °· Only take over-the-counter or prescription medicines for pain, discomfort, diarrhea, or fever as directed by your caregiver. °· Drink enough water and fluids to keep your urine clear or pale yellow. Sports drinks can provide valuable electrolytes, sugars, and hydration. °· Get plenty of rest and maintain proper nutrition. Soups and broths with crackers or rice are fine. °SEEK IMMEDIATE MEDICAL CARE IF:  °· You have severe headaches, shortness of breath, chest pain, neck pain, or an unusual rash. °· You have uncontrolled vomiting, diarrhea, or you are unable to keep down fluids. °· You or your child has an oral temperature above 102° F (38.9° C), not controlled by medicine. °· Your baby is older than 3 months with a rectal temperature of 102° F (38.9° C) or higher. °· Your baby is 3 months old or younger with a rectal temperature of 100.4° F (38° C) or higher. °MAKE SURE YOU:  °· Understand these instructions. °· Will watch your condition. °· Will get help right away if you are not doing well or get worse. °Document Released: 12/02/2004 Document Revised: 05/17/2011 Document Reviewed: 06/29/2010 °ExitCare® Patient Information ©2015 [employer]. This information is not intended to replace advice given  to you by your health care provider. Make sure you discuss any questions you have with your health care provider. ° °

## 2014-03-05 LAB — CULTURE, GROUP A STREP

## 2014-05-19 ENCOUNTER — Encounter (HOSPITAL_BASED_OUTPATIENT_CLINIC_OR_DEPARTMENT_OTHER): Payer: Self-pay | Admitting: Emergency Medicine

## 2014-05-19 ENCOUNTER — Emergency Department (HOSPITAL_BASED_OUTPATIENT_CLINIC_OR_DEPARTMENT_OTHER)
Admission: EM | Admit: 2014-05-19 | Discharge: 2014-05-20 | Disposition: A | Discharge status: Home / Self Care | Payer: Medicaid Other | Attending: Emergency Medicine | Admitting: Emergency Medicine

## 2014-05-19 DIAGNOSIS — Z792 Long term (current) use of antibiotics: Secondary | ICD-10-CM | POA: Insufficient documentation

## 2014-05-19 DIAGNOSIS — Z79899 Other long term (current) drug therapy: Secondary | ICD-10-CM | POA: Insufficient documentation

## 2014-05-19 DIAGNOSIS — F319 Bipolar disorder, unspecified: Secondary | ICD-10-CM | POA: Insufficient documentation

## 2014-05-19 DIAGNOSIS — Y9389 Activity, other specified: Secondary | ICD-10-CM | POA: Insufficient documentation

## 2014-05-19 DIAGNOSIS — Y9289 Other specified places as the place of occurrence of the external cause: Secondary | ICD-10-CM | POA: Insufficient documentation

## 2014-05-19 DIAGNOSIS — X838XXA Intentional self-harm by other specified means, initial encounter: Secondary | ICD-10-CM | POA: Insufficient documentation

## 2014-05-19 DIAGNOSIS — Z7289 Other problems related to lifestyle: Secondary | ICD-10-CM

## 2014-05-19 DIAGNOSIS — Z791 Long term (current) use of non-steroidal anti-inflammatories (NSAID): Secondary | ICD-10-CM | POA: Insufficient documentation

## 2014-05-19 DIAGNOSIS — Y998 Other external cause status: Secondary | ICD-10-CM | POA: Insufficient documentation

## 2014-05-19 DIAGNOSIS — IMO0002 Reserved for concepts with insufficient information to code with codable children: Secondary | ICD-10-CM

## 2014-05-19 DIAGNOSIS — M1990 Unspecified osteoarthritis, unspecified site: Secondary | ICD-10-CM | POA: Insufficient documentation

## 2014-05-19 DIAGNOSIS — S60812A Abrasion of left wrist, initial encounter: Secondary | ICD-10-CM | POA: Insufficient documentation

## 2014-05-19 NOTE — ED Notes (Deleted)
Patient showed me her left arm, told me some scratches were self inflicted, some other scratches are from a recent fall down an embankment with briars. Patient  stated " I need some help" Patient was tearful during taking of vitals. 

## 2014-05-19 NOTE — ED Notes (Signed)
Pt admits to causing superficial lacerations to left inner forearm that are in various stages of healinig but denies suicidal ideation. States Hx bipolar disorder and feels the cutting helps her maintain control of her emotions

## 2014-05-20 NOTE — ED Notes (Signed)
Patient showed me her left arm, told me some scratches were self inflicted, some other scratches are from a recent fall down an embankment with briars. Patient  stated " I need some help" Patient was tearful during taking of vitals.

## 2014-05-20 NOTE — ED Notes (Signed)
Wound on left forearm cleaned and bacitracin applied.  Dressing applied.  Instructed patient to keep area covered when around her pets.  Patient voiced understanding.

## 2014-05-20 NOTE — ED Notes (Signed)
I was present for evaluation with EDP.  Patient consistent in story given to EDP as well as the one she told me previous to EDP evaluation.  States she has no intentions of hurting herself at present. And that she will call PCP Dr. Lillie ColumbiaNifong in morning for evaluation of current medicines.

## 2014-05-20 NOTE — ED Provider Notes (Addendum)
CSN: [id number]     Arrival date & time 05/19/14  2304 History   First MD Initiated Contact with Patient 05/20/14 0003     Chief Complaint  Patient presents with  . Arm Injury      (Consider location/radiation/quality/duration/timing/severity/associated sxs/prior Treatment) HPI  This is a 29 year old female who states she has a history of bipolar disorder but no longer has insurance and thus cannot afford her medications. When she becomes frustrated she takes out her frustration by performing self-injurious actions such as punching the wall or scratching her left wrist. She is here with multiple superficial abrasions to the volar left wrist. She admits that the horizontal ones are from several days ago and self-inflicted. She also states that the long abrasions perpendicular to the self-inflicted ones were caused by scraping her arm on a rose bush during a recent fall. She denies any suicidal ideation or homicidal ideation. She states she came to the ED because a relative insisted.  Past Medical History  Diagnosis Date  . DJD (degenerative joint disease)    Past Surgical History  Procedure Laterality Date  . Appendectomy    . Cholecystectomy    . Back surgery    . Cesarean section    . Vulvectomy     History reviewed. No pertinent family history. History  Substance Use Topics  . Smoking status: Never Smoker   . Smokeless tobacco: Not on file  . Alcohol Use: No   OB History    No data available     Review of Systems  All other systems reviewed and are negative.   Allergies  Demerol and Morphine and related  Home Medications   Prior to Admission medications   Medication Sig Start Date End Date Taking? Authorizing Provider  albuterol (PROVENTIL HFA;VENTOLIN HFA) 108 (90 BASE) MCG/ACT inhaler Inhale 2 puffs into the lungs every 4 (four) hours as needed for wheezing or shortness of breath. 03/03/14   Gilda Creasehristopher J Pollina, MD  benzonatate (TESSALON) 100 MG capsule Take 1  capsule (100 mg total) by mouth every 8 (eight) hours. 03/03/14   Gilda Creasehristopher J Pollina, MD  clindamycin (CLEOCIN) 300 MG capsule Take 1 capsule (300 mg total) by mouth 4 (four) times daily. X 7 days 04/01/13   Geoffery Lyonsouglas Delo, MD  cyclobenzaprine (FLEXERIL) 10 MG tablet Take 1 tablet (10 mg total) by mouth 2 (two) times daily as needed for muscle spasms. 09/19/12   Gwyneth SproutWhitney Plunkett, MD  HYDROcodone-homatropine (HYCODAN) 5-1.5 MG/5ML syrup Take 5 mLs by mouth every 6 (six) hours as needed for cough. 03/03/14   Gilda Creasehristopher J Pollina, MD  hydrOXYzine (VISTARIL) 50 MG capsule Take 50 mg by mouth 3 (three) times daily as needed for itching.    Historical Provider, MD  lurasidone (LATUDA) 80 MG TABS Take by mouth daily with breakfast.    Historical Provider, MD  naproxen (NAPROSYN) 375 MG tablet Take 1 tablet (375 mg total) by mouth 2 (two) times daily. 06/07/13   Rolan BuccoMelanie Belfi, MD  oxyCODONE-acetaminophen (PERCOCET) 5-325 MG per tablet Take 2 tablets by mouth every 4 (four) hours as needed. 04/01/13   Geoffery Lyonsouglas Delo, MD  predniSONE (DELTASONE) 20 MG tablet Take 2 tablets (40 mg total) by mouth daily with breakfast. 03/03/14   Gilda Creasehristopher J Pollina, MD  pregabalin (LYRICA) 50 MG capsule Take 50 mg by mouth 3 (three) times daily.    Historical Provider, MD  traMADol (ULTRAM) 50 MG tablet Take 1 tablet (50 mg total) by mouth every 6 (six) hours  as needed. 06/07/13   Rolan Bucco, MD   BP 118/68 mmHg  Pulse 74  Temp(Src) 98 F (36.7 C) (Oral)  Resp 20  Ht  (1.651 m)  Wt 170 lb (77.111 kg)  BMI 28.29 kg/m2  SpO2 100%  LMP 05/05/2014   Physical Exam  General: Well-developed, well-nourished female in no acute distress; appearance consistent with age of record HENT: normocephalic; atraumatic Eyes: pupils equal, round and reactive to light; extraocular muscles intact Neck: supple Heart: regular rate and rhythm Lungs: clear to auscultation bilaterally Abdomen: soft; nondistended; nontender; bowel sounds  present Extremities: No deformity; full range of motion; pulses normal Neurologic: Awake, alert and oriented; motor function intact in all extremities and symmetric; no facial droop Skin: Warm and dry; multiple, superficial, linear abrasions to left volar forearm Psychiatric: Normal mood and affect; denies SI or HI    ED Course  Procedures (including critical care time)   MDM  The patient explains that she needs help getting back on her Latuda. It is a very expensive medication and she intends to discuss this with her primary care physician, Dr. Lillie Columbia. On my examination the patient is not exhibiting any signs of suicidal ideation or behavior. She is appropriate and up front about her self injury history. Involuntary commitment at this time is not indicated.  Paula Libra, MD 05/20/14 4098  Paula Libra, MD 05/20/14 1191

## 2014-05-20 NOTE — ED Notes (Signed)
Patient presents to ED with scratches to left forearm.  Reports this was from falling and scratching her arms on briars.  Patient also has older marks to left forearm which she reports were self inflicted and 251 week old.  Reports she hurts herself due to her bipolar disorder and not currently having medication due to cost.  Reports she is going to call her doctor tomorrow to talk to them about medication options.  Patient reports she does not want to hurt herself at present.

## 2014-08-01 ENCOUNTER — Emergency Department (HOSPITAL_BASED_OUTPATIENT_CLINIC_OR_DEPARTMENT_OTHER)
Admission: EM | Admit: 2014-08-01 | Discharge: 2014-08-01 | Disposition: A | Discharge status: Home / Self Care | Payer: No Typology Code available for payment source | Attending: Emergency Medicine | Admitting: Emergency Medicine

## 2014-08-01 ENCOUNTER — Encounter (HOSPITAL_BASED_OUTPATIENT_CLINIC_OR_DEPARTMENT_OTHER): Payer: Self-pay | Admitting: *Deleted

## 2014-08-01 ENCOUNTER — Emergency Department (HOSPITAL_BASED_OUTPATIENT_CLINIC_OR_DEPARTMENT_OTHER): Payer: No Typology Code available for payment source

## 2014-08-01 DIAGNOSIS — Z3202 Encounter for pregnancy test, result negative: Secondary | ICD-10-CM | POA: Diagnosis not present

## 2014-08-01 DIAGNOSIS — Z981 Arthrodesis status: Secondary | ICD-10-CM | POA: Insufficient documentation

## 2014-08-01 DIAGNOSIS — W010XXA Fall on same level from slipping, tripping and stumbling without subsequent striking against object, initial encounter: Secondary | ICD-10-CM | POA: Insufficient documentation

## 2014-08-01 DIAGNOSIS — S3992XA Unspecified injury of lower back, initial encounter: Secondary | ICD-10-CM | POA: Insufficient documentation

## 2014-08-01 DIAGNOSIS — M545 Low back pain: Secondary | ICD-10-CM

## 2014-08-01 DIAGNOSIS — Z79899 Other long term (current) drug therapy: Secondary | ICD-10-CM | POA: Diagnosis not present

## 2014-08-01 DIAGNOSIS — Y9301 Activity, walking, marching and hiking: Secondary | ICD-10-CM | POA: Diagnosis not present

## 2014-08-01 DIAGNOSIS — S79912A Unspecified injury of left hip, initial encounter: Secondary | ICD-10-CM | POA: Insufficient documentation

## 2014-08-01 DIAGNOSIS — Z792 Long term (current) use of antibiotics: Secondary | ICD-10-CM | POA: Diagnosis not present

## 2014-08-01 DIAGNOSIS — M5440 Lumbago with sciatica, unspecified side: Secondary | ICD-10-CM | POA: Diagnosis not present

## 2014-08-01 DIAGNOSIS — W19XXXA Unspecified fall, initial encounter: Secondary | ICD-10-CM

## 2014-08-01 DIAGNOSIS — Z7952 Long term (current) use of systemic steroids: Secondary | ICD-10-CM | POA: Diagnosis not present

## 2014-08-01 DIAGNOSIS — M1990 Unspecified osteoarthritis, unspecified site: Secondary | ICD-10-CM | POA: Diagnosis not present

## 2014-08-01 DIAGNOSIS — Y92512 Supermarket, store or market as the place of occurrence of the external cause: Secondary | ICD-10-CM | POA: Diagnosis not present

## 2014-08-01 DIAGNOSIS — Y998 Other external cause status: Secondary | ICD-10-CM | POA: Insufficient documentation

## 2014-08-01 DIAGNOSIS — M25552 Pain in left hip: Secondary | ICD-10-CM

## 2014-08-01 LAB — PREGNANCY, URINE: Preg Test, Ur: NEGATIVE

## 2014-08-01 MED ORDER — ACETAMINOPHEN 325 MG PO TABS
650.0000 mg | ORAL_TABLET | Freq: Once | ORAL | Status: AC
  Administered 2014-08-01: 650 mg via ORAL
  Filled 2014-08-01: qty 2

## 2014-08-01 MED ORDER — NAPROXEN 500 MG PO TABS: 500.0000 mg | ORAL_TABLET | Freq: Two times a day (BID) | ORAL | Status: AC

## 2014-08-01 NOTE — Discharge Instructions (Signed)
Back Pain, Adult °Low back pain is very common. About 1 in 5 people have back pain. The cause of low back pain is rarely dangerous. The pain often gets better over time. About half of people with a sudden onset of back pain feel better in just 2 weeks. About 8 in 10 people feel better by 6 weeks.  °CAUSES °Some common causes of back pain include: °· Strain of the muscles or ligaments supporting the spine. °· Wear and tear (degeneration) of the spinal discs. °· Arthritis. °· Direct injury to the back. °DIAGNOSIS °Most of the time, the direct cause of low back pain is not known. However, back pain can be treated effectively even when the exact cause of the pain is unknown. Answering your caregiver's questions about your overall health and symptoms is one of the most accurate ways to make sure the cause of your pain is not dangerous. If your caregiver needs more information, he or she may order lab work or imaging tests (X-rays or MRIs). However, even if imaging tests show changes in your back, this usually does not require surgery. °HOME CARE INSTRUCTIONS °For many people, back pain returns. Since low back pain is rarely dangerous, it is often a condition that people can learn to manage on their own.  °· Remain active. It is stressful on the back to sit or stand in one place. Do not sit, drive, or stand in one place for more than 30 minutes at a time. Take short walks on level surfaces as soon as pain allows. Try to increase the length of time you walk each day. °· Do not stay in bed. Resting more than 1 or 2 days can delay your recovery. °· Do not avoid exercise or work. Your body is made to move. It is not dangerous to be active, even though your back may hurt. Your back will likely heal faster if you return to being active before your pain is gone. °· Pay attention to your body when you  bend and lift. Many people have less discomfort when lifting if they bend their knees, keep the load close to their bodies, and  avoid twisting. Often, the most comfortable positions are those that put less stress on your recovering back. °· Find a comfortable position to sleep. Use a firm mattress and lie on your side with your knees slightly bent. If you lie on your back, put a pillow under your knees. °· Only take over-the-counter or prescription medicines as directed by your caregiver. Over-the-counter medicines to reduce pain and inflammation are often the most helpful. Your caregiver may prescribe muscle relaxant drugs. These medicines help dull your pain so you can more quickly return to your normal activities and healthy exercise. °· Put ice on the injured area. °¨ Put ice in a plastic bag. °¨ Place a towel between your skin and the bag. °¨ Leave the ice on for 15-20 minutes, 03-04 times a day for the first 2 to 3 days. After that, ice and heat may be alternated to reduce pain and spasms. °· Ask your caregiver about trying back exercises and gentle massage. This may be of some benefit. °· Avoid feeling anxious or stressed. Stress increases muscle tension and can worsen back pain. It is important to recognize when you are anxious or stressed and learn ways to manage it. Exercise is a great option. °SEEK MEDICAL CARE IF: °· You have pain that is not relieved with rest or medicine. °· You have pain that does not improve in 1 week. °· You have new symptoms. °· You are generally not feeling well. °SEEK   IMMEDIATE MEDICAL CARE IF:  °· You have pain that radiates from your back into your legs. °· You develop new bowel or bladder control problems. °· You have unusual weakness or numbness in your arms or legs. °· You develop nausea or vomiting. °· You develop abdominal pain. °· You feel faint. °Document Released: 02/22/2005 Document Revised: 08/24/2011 Document Reviewed: 06/26/2013 °ExitCare® Patient Information ©2015 [employer]. This information is not intended to replace advice given to you by your health care provider. Make sure you  discuss any questions you have with your health care provider. ° °Back Exercises °Back exercises help treat and prevent back injuries. The goal of back exercises is to increase the strength of your abdominal and back muscles and the flexibility of your back. These exercises should be started when you no longer have back pain. Back exercises include: °· Pelvic Tilt. Lie on your back with your knees bent. Tilt your pelvis until the lower part of your back is against the floor. Hold this position 5 to 10 sec and repeat 5 to 10 times. °· Knee to Chest. Pull first 1 knee up against your chest and hold for 20 to 30 seconds, repeat this with the other knee, and then both knees. This may be done with the other leg straight or bent, whichever feels better. °· Sit-Ups or Curl-Ups. Bend your knees 90 degrees. Start with tilting your pelvis, and do a partial, slow sit-up, lifting your trunk only 30 to 45 degrees off the floor. Take at least 2 to 3 seconds for each sit-up. Do not do sit-ups with your knees out straight. If partial sit-ups are difficult, simply do the above but with only tightening your abdominal muscles and holding it as directed. °· Hip-Lift. Lie on your back with your knees flexed 90 degrees. Push down with your feet and shoulders as you raise your hips a couple inches off the floor; hold for 10 seconds, repeat 5 to 10 times. °· Back arches. Lie on your stomach, propping yourself up on bent elbows. Slowly press on your hands, causing an arch in your low back. Repeat 3 to 5 times. Any initial stiffness and discomfort should lessen with repetition over time. °· Shoulder-Lifts. Lie face down with arms beside your body. Keep hips and torso pressed to floor as you slowly lift your head and shoulders off the floor. °Do not overdo your exercises, especially in the beginning. Exercises may cause you some mild back discomfort which lasts for a few minutes; however, if the pain is more severe, or lasts for more than 15  minutes, do not continue exercises until you see your caregiver. Improvement with exercise therapy for back problems is slow.  °See your caregivers for assistance with developing a proper back exercise program. °Document Released: 04/01/2004 Document Revised: 05/17/2011 Document Reviewed: 12/24/2010 °ExitCare® Patient Information ©2015 [employer]. This information is not intended to replace advice given to you by your health care provider. Make sure you discuss any questions you have with your health care provider. ° °

## 2014-08-01 NOTE — ED Notes (Signed)
Patient transported to X-ray 

## 2014-08-01 NOTE — ED Notes (Signed)
Pt requests dilaudid for her pain, states that demerol and morphine make her nauseous.

## 2014-08-01 NOTE — ED Notes (Signed)
Pt c/o fall in [employer] landing on tile floor injuring lower back and left hip

## 2014-08-01 NOTE — ED Notes (Signed)
PA at bedside.

## 2014-08-01 NOTE — ED Provider Notes (Signed)
CSN: [id number]     Arrival date & time 08/01/14  1603 History   First MD Initiated Contact with Patient 08/01/14 1644     Chief Complaint  Patient presents with  . Fall   Whitney Ayers is a 29 y.o. female with a history of L3-4 fusion in 2011 and degenerative disc disease who presents to the emergency department complaining of left low back and left hip pain after she slipped in [employer] landing on her left side. Patient [place] [place] slipped in a puddle of shampoo and her left leg went out from under her. She reports landing on her left leg and hip. She denies head injury or loss of consciousness. She has any numbness or tingling. She reports feeling like her left leg is weak due to pain. She went to 7 out of 10 sharp pain that rates from her left low back into her left leg. She reports her pain is worse with movement. She reports a history of L3-4 fusion by Dr. Rochele Pagesook due to degenerative disc disease. She reports being patient of [facility]. The patient denies fevers, head injury, loss of consciousness, numbness, tingling, loss of bladder control, loss of bowel control, difficulty urinating, urinary symptoms, abdominal pain, nausea, vomiting.   (Consider location/radiation/quality/duration/timing/severity/associated sxs/prior Treatment) HPI  Past Medical History  Diagnosis Date  . DJD (degenerative joint disease)    Past Surgical History  Procedure Laterality Date  . Appendectomy    . Cholecystectomy    . Back surgery    . Cesarean section    . Vulvectomy     History reviewed. No pertinent family history. History  Substance Use Topics  . Smoking status: Never Smoker   . Smokeless tobacco: Not on file  . Alcohol Use: No   OB History    No data available     Review of Systems  Constitutional: Negative for fever and chills.  HENT: Negative for congestion and sore throat.   Eyes: Negative for visual disturbance.  Respiratory: Negative  for cough and shortness of breath.   Cardiovascular: Negative for chest pain and palpitations.  Gastrointestinal: Negative for nausea, vomiting and abdominal pain.  Genitourinary: Negative for dysuria, urgency, frequency, hematuria, flank pain and difficulty urinating.  Musculoskeletal: Positive for back pain. Negative for neck pain and neck stiffness.  Skin: Negative for rash and wound.  Neurological: Positive for weakness. Negative for syncope, light-headedness, numbness and headaches.      Allergies  Demerol and Morphine and related  Home Medications   Prior to Admission medications   Medication Sig Start Date End Date Taking? Authorizing Provider  albuterol (PROVENTIL HFA;VENTOLIN HFA) 108 (90 BASE) MCG/ACT inhaler Inhale 2 puffs into the lungs every 4 (four) hours as needed for wheezing or shortness of breath. 03/03/14   Gilda Creasehristopher J Pollina, MD  benzonatate (TESSALON) 100 MG capsule Take 1 capsule (100 mg total) by mouth every 8 (eight) hours. 03/03/14   Gilda Creasehristopher J Pollina, MD  clindamycin (CLEOCIN) 300 MG capsule Take 1 capsule (300 mg total) by mouth 4 (four) times daily. X 7 days 04/01/13   Geoffery Lyonsouglas Delo, MD  cyclobenzaprine (FLEXERIL) 10 MG tablet Take 1 tablet (10 mg total) by mouth 2 (two) times daily as needed for muscle spasms. 09/19/12   Gwyneth SproutWhitney Plunkett, MD  HYDROcodone-homatropine (HYCODAN) 5-1.5 MG/5ML syrup Take 5 mLs by mouth every 6 (six) hours as needed for cough. 03/03/14   Gilda Creasehristopher J Pollina, MD  hydrOXYzine (VISTARIL) 50 MG  capsule Take 50 mg by mouth 3 (three) times daily as needed for itching.    Historical Provider, MD  lurasidone (LATUDA) 80 MG TABS Take by mouth daily with breakfast.    Historical Provider, MD  naproxen (NAPROSYN) 500 MG tablet Take 1 tablet (500 mg total) by mouth 2 (two) times daily with a meal. 08/01/14   Everlene Farrier, PA-C  oxyCODONE-acetaminophen (PERCOCET) 5-325 MG per tablet Take 2 tablets by mouth every 4 (four) hours as needed.  04/01/13   Geoffery Lyons, MD  predniSONE (DELTASONE) 20 MG tablet Take 2 tablets (40 mg total) by mouth daily with breakfast. 03/03/14   Gilda Crease, MD  pregabalin (LYRICA) 50 MG capsule Take 50 mg by mouth 3 (three) times daily.    Historical Provider, MD  traMADol (ULTRAM) 50 MG tablet Take 1 tablet (50 mg total) by mouth every 6 (six) hours as needed. 06/07/13   Rolan Bucco, MD   BP 108/65 mmHg  Pulse 94  Temp(Src) 98.1 F (36.7 C) (Oral)  Resp 18  Ht 5\' 5"  (1.651 m)  Wt 165 lb (74.844 kg)  BMI 27.46 kg/m2  SpO2 100%  LMP 07/02/2014 Physical Exam  Constitutional: She is oriented to person, place, and time. She appears well-developed and well-nourished. No distress.  Nontoxic appearing.  HENT:  Head: Normocephalic and atraumatic.  Mouth/Throat: Oropharynx is clear and moist. No oropharyngeal exudate.  Eyes: Conjunctivae are normal. Pupils are equal, round, and reactive to light. Right eye exhibits no discharge. Left eye exhibits no discharge.  Neck: Normal range of motion. Neck supple. No JVD present. No tracheal deviation present.  No midline neck tenderness.  Cardiovascular: Normal rate, regular rhythm, normal heart sounds and intact distal pulses.  Exam reveals no gallop and no friction rub.   No murmur heard. Bilateral radial, posterior tibialis and dorsalis pedis pulses are intact.  Heart rate 88.  Pulmonary/Chest: Effort normal and breath sounds normal. No respiratory distress. She has no wheezes. She has no rales.  Abdominal: Soft. She exhibits no distension. There is no tenderness.  Musculoskeletal: She exhibits tenderness. She exhibits no edema.  Patient with left sided low back tenderness and midline upper lumbar to lower lumbar tenderness. There is a scar from her previous lumbar surgery. No back edema, step-offs, crepitus or erythema. Patient is spontaneously moving all extremities in a coordinated fashion exhibiting good strength. Pain at her hip with  manipulation of her left leg. Patient able to plantar and dorsiflex without difficulty. No knee or lower leg tenderness.   Lymphadenopathy:    She has no cervical adenopathy.  Neurological: She is alert and oriented to person, place, and time. She has normal reflexes. She displays normal reflexes. Coordination normal.  Bilateral patellar DTRs are intact. Sensation is intact to her bilateral lower extremities.  Skin: Skin is warm and dry. No rash noted. She is not diaphoretic. No erythema. No pallor.  No evidence of abrasions, ecchymosis or edema.  Psychiatric: She has a normal mood and affect. Her behavior is normal.  Nursing note and vitals reviewed.   ED Course  Procedures (including critical care time) Labs Review Labs Reviewed  PREGNANCY, URINE    Imaging Review Dg Thoracic Spine W/swimmers  08/01/2014   CLINICAL DATA:  Larey Seat on a wet floor today and injured back.  EXAM: THORACIC SPINE - 2 VIEW + SWIMMERS; LUMBAR SPINE - COMPLETE 4+ VIEW  COMPARISON:  None.  FINDINGS: Thoracic spine:  Normal alignment of the thoracic vertebral bodies. Disc spaces  and vertebral bodies are maintained. No acute compression fracture. No abnormal paraspinal soft tissue thickening. The visualized posterior ribs are intact.  Lumbar spine:  Mild left convex lumbar scoliosis. There are right-sided pedicle screws and a posterior rod along with interbody fusion devices at L4-5 and L5-S1. No complicating features are demonstrated. Normal alignment in the lateral plane. Disc spaces are maintained. No acute bony findings. The visualized bony pelvis is intact. The SI joints appear normal.  IMPRESSION: 1. Normal thoracic spine series. 2. L4-5 and L5-S1 fusion changes without complicating features. 3. No acute bony findings involving the lumbar spine.   Electronically Signed   By: Rudie Meyer M.D.   On: 08/01/2014 18:57   Dg Lumbar Spine Complete  08/01/2014   CLINICAL DATA:  Larey Seat on a wet floor today and injured back.   EXAM: THORACIC SPINE - 2 VIEW + SWIMMERS; LUMBAR SPINE - COMPLETE 4+ VIEW  COMPARISON:  None.  FINDINGS: Thoracic spine:  Normal alignment of the thoracic vertebral bodies. Disc spaces and vertebral bodies are maintained. No acute compression fracture. No abnormal paraspinal soft tissue thickening. The visualized posterior ribs are intact.  Lumbar spine:  Mild left convex lumbar scoliosis. There are right-sided pedicle screws and a posterior rod along with interbody fusion devices at L4-5 and L5-S1. No complicating features are demonstrated. Normal alignment in the lateral plane. Disc spaces are maintained. No acute bony findings. The visualized bony pelvis is intact. The SI joints appear normal.  IMPRESSION: 1. Normal thoracic spine series. 2. L4-5 and L5-S1 fusion changes without complicating features. 3. No acute bony findings involving the lumbar spine.   Electronically Signed   By: Rudie Meyer M.D.   On: 08/01/2014 18:57   Dg Hip Unilat With Pelvis 2-3 Views Left  08/01/2014   CLINICAL DATA:  Fall, acute left hip pain  EXAM: LEFT HIP (WITH PELVIS) 2-3 VIEWS  COMPARISON:  09/19/2012  FINDINGS: Postop changes of the lower lumbar spine at L4-S1 with anterior and posterior fusion.  Left hip appears intact. Negative for fracture or malalignment. Hips appear symmetric. Pelvis intact. Stable appearing SI joints. No diastases. Normal bowel gas pattern.  IMPRESSION: No acute osseous finding.   Electronically Signed   By: Judie Petit.  Shick M.D.   On: 08/01/2014 18:57     EKG Interpretation None      Filed Vitals:   08/01/14 1608 08/01/14 1913  BP: 113/73 108/65  Pulse: 105 94  Temp: 98.1 F (36.7 C)   TempSrc: Oral   Resp: 16 18  Height:  (1.651 m)   Weight: 165 lb (74.844 kg)   SpO2: 100% 100%     MDM   Meds given in ED:  Medications  acetaminophen (TYLENOL) tablet 650 mg (650 mg Oral Given 08/01/14 1730)    New Prescriptions   NAPROXEN (NAPROSYN) 500 MG TABLET    Take 1 tablet (500 mg  total) by mouth 2 (two) times daily with a meal.    Final diagnoses:  Fall  Left hip pain  Left low back pain, with sciatica presence unspecified   This is a 29 year old female with history of lumbar fusion and degenerative disc disease to present to the emergency department complaining of left low back and left hip pain after a slip and fall at [facility] today. Patient had midline lumbar back tenderness as well as pain with manipulation of her leg at her left hip. No neurological deficits and normal neuro exam.  Patient can walk but states is  painful.  No loss of bowel or bladder control.  No concern for cauda equina.  No fever, night sweats, weight loss, h/o cancer, IVDU.  Thoracic spine, lumbar spine and left hip unilateral pelvis are negative for acute bony findings. RICE protocol and pain medicine indicated and discussed with patient.  Advised to follow-up with her PCP and pain management specialist. We'll discharge with a prescription for naproxen. Patient does note improvement with Tylenol in the ED. Patient is able to ambulate without assistance in the room prior to discharge. I advised the patient to follow-up with their primary care provider this week. I advised the patient to return to the emergency department with new or worsening symptoms or new concerns. The patient verbalized understanding and agreement with plan.      Everlene Farrier, PA-C 08/01/14 1931  Geoffery Lyons, MD 08/02/14 [phone]

## 2014-08-06 ENCOUNTER — Emergency Department (HOSPITAL_BASED_OUTPATIENT_CLINIC_OR_DEPARTMENT_OTHER)
Admission: EM | Admit: 2014-08-06 | Discharge: 2014-08-06 | Disposition: A | Discharge status: Home / Self Care | Payer: Medicaid Other | Attending: Emergency Medicine | Admitting: Emergency Medicine

## 2014-08-06 ENCOUNTER — Encounter (HOSPITAL_BASED_OUTPATIENT_CLINIC_OR_DEPARTMENT_OTHER): Payer: Self-pay | Admitting: Emergency Medicine

## 2014-08-06 DIAGNOSIS — Z8739 Personal history of other diseases of the musculoskeletal system and connective tissue: Secondary | ICD-10-CM | POA: Insufficient documentation

## 2014-08-06 DIAGNOSIS — Z791 Long term (current) use of non-steroidal anti-inflammatories (NSAID): Secondary | ICD-10-CM | POA: Insufficient documentation

## 2014-08-06 DIAGNOSIS — L259 Unspecified contact dermatitis, unspecified cause: Secondary | ICD-10-CM | POA: Insufficient documentation

## 2014-08-06 DIAGNOSIS — Z79899 Other long term (current) drug therapy: Secondary | ICD-10-CM | POA: Insufficient documentation

## 2014-08-06 DIAGNOSIS — Z7952 Long term (current) use of systemic steroids: Secondary | ICD-10-CM | POA: Insufficient documentation

## 2014-08-06 NOTE — ED Provider Notes (Signed)
CSN: [id number]     Arrival date & time 08/06/14  1913 History  This chart was scribed for Nelva Nay, MD by Phillis Haggis, ED Scribe. This patient was seen in room MHT13/[facility]13 and patient care was started at 9:12 PM.       Chief Complaint  Patient presents with  . Rash   The history is provided by the patient. No language interpreter was used.    HPI Comments: Whitney Ayers is a 29 y.o. female who presents to the Emergency Department complaining of a gradually worsening, itching rash to bilateral elbows, back of arms and legs onset one week ago. She states that the rash started at her arms and continued to spread. She states that she thought it was irritation from a sequin shirt at first. She denies any other symptoms at this time, such as SOB or throat swelling. She denies putting anything on the rash. She reports taking cyclobenzaprine for her spinal fusion daily. She denies any other significant medical problems.   Past Medical History  Diagnosis Date  . DJD (degenerative joint disease)    Past Surgical History  Procedure Laterality Date  . Appendectomy    . Cholecystectomy    . Back surgery    . Cesarean section    . Vulvectomy     History reviewed. No pertinent family history. History  Substance Use Topics  . Smoking status: Never Smoker   . Smokeless tobacco: Not on file  . Alcohol Use: No   OB History    No data available     Review of Systems A complete 10 system review of systems was obtained and all systems are negative except as noted in the HPI and PMH.   Allergies  Demerol and Morphine and related  Home Medications   Prior to Admission medications   Medication Sig Start Date End Date Taking? Authorizing Provider  albuterol (PROVENTIL HFA;VENTOLIN HFA) 108 (90 BASE) MCG/ACT inhaler Inhale 2 puffs into the lungs every 4 (four) hours as needed for wheezing or shortness of breath. 03/03/14   Gilda Crease, MD  benzonatate (TESSALON) 100 MG  capsule Take 1 capsule (100 mg total) by mouth every 8 (eight) hours. 03/03/14   Gilda Crease, MD  clindamycin (CLEOCIN) 300 MG capsule Take 1 capsule (300 mg total) by mouth 4 (four) times daily. X 7 days 04/01/13   Geoffery Lyons, MD  cyclobenzaprine (FLEXERIL) 10 MG tablet Take 1 tablet (10 mg total) by mouth 2 (two) times daily as needed for muscle spasms. 09/19/12   Gwyneth Sprout, MD  HYDROcodone-homatropine (HYCODAN) 5-1.5 MG/5ML syrup Take 5 mLs by mouth every 6 (six) hours as needed for cough. 03/03/14   Gilda Crease, MD  hydrOXYzine (VISTARIL) 50 MG capsule Take 50 mg by mouth 3 (three) times daily as needed for itching.    Historical Provider, MD  lurasidone (LATUDA) 80 MG TABS Take by mouth daily with breakfast.    Historical Provider, MD  naproxen (NAPROSYN) 500 MG tablet Take 1 tablet (500 mg total) by mouth 2 (two) times daily with a meal. 08/01/14   Everlene Farrier, PA-C  oxyCODONE-acetaminophen (PERCOCET) 5-325 MG per tablet Take 2 tablets by mouth every 4 (four) hours as needed. 04/01/13   Geoffery Lyons, MD  predniSONE (DELTASONE) 20 MG tablet Take 2 tablets (40 mg total) by mouth daily with breakfast. 03/03/14   Gilda Crease, MD  pregabalin (LYRICA) 50 MG capsule Take 50 mg by mouth 3 (three) times daily.  Historical Provider, MD  traMADol (ULTRAM) 50 MG tablet Take 1 tablet (50 mg total) by mouth every 6 (six) hours as needed. 06/07/13   Rolan BuccoMelanie Belfi, MD   BP 103/69 mmHg  Pulse 90  Temp(Src) 98.8 F (37.1 C) (Oral)  Resp 18  Ht 5\' 5"  (1.651 m)  Wt 165 lb (74.844 kg)  BMI 27.46 kg/m2  SpO2 100%  LMP 08/06/2014 Physical Exam  Constitutional: She is oriented to person, place, and time. She appears well-developed and well-nourished. No distress.  HENT:  Head: Normocephalic and atraumatic.  Eyes: Pupils are equal, round, and reactive to light.  Neck: Normal range of motion.  Cardiovascular: Normal rate and intact distal pulses.   Pulmonary/Chest: No  respiratory distress.  Abdominal: Normal appearance. She exhibits no distension.  Musculoskeletal: Normal range of motion.  Neurological: She is alert and oriented to person, place, and time. No cranial nerve deficit.  Skin: Skin is warm and dry. Rash noted.  Psychiatric: She has a normal mood and affect. Her behavior is normal.  Nursing note and vitals reviewed.   ED Course  Procedures (including critical care time) DIAGNOSTIC STUDIES: Oxygen Saturation is 100% on room air, normal by my interpretation.    COORDINATION OF CARE: 9:15 PM-Discussed treatment plan which includes hydrocortisone cream occlusive dressing and benadryl with pt at bedside and pt agreed to plan.   Labs Review Labs Reviewed - No data to display  Imaging Review No results found.   EKG Interpretation None      MDM   Final diagnoses:  Contact dermatitis   I personally performed the services described in this documentation, which was scribed in my presence. The recorded information has been reviewed and considered.     Nelva Nayobert Chandlar Staebell, MD 08/14/14 [phone]

## 2014-08-06 NOTE — Discharge Instructions (Signed)
Use 1% hydrocortisone cream with occlusive dressings as I instructed.  Contact Dermatitis Contact dermatitis is a rash that happens when something touches the skin. You touched something that irritates your skin, or you have allergies to something you touched. HOME CARE   Avoid the thing that caused your rash.  Keep your rash away from hot water, soap, sunlight, chemicals, and other things that might bother it.  Do not scratch your rash.  You can take cool baths to help stop itching.  Only take medicine as told by your doctor.  Keep all doctor visits as told. GET HELP RIGHT AWAY IF:   Your rash is not better after 3 days.  Your rash gets worse.  Your rash is puffy (swollen), tender, red, sore, or warm.  You have problems with your medicine. MAKE SURE YOU:   Understand these instructions.  Will watch your condition.  Will get help right away if you are not doing well or get worse. Document Released: 12/20/2008 Document Revised: 05/17/2011 Document Reviewed: 07/28/2010 [facility] Patient Information 2015 [place], [place]. This information is not intended to replace advice given to you by your health care provider. Make sure you discuss any questions you have with your health care provider.

## 2014-08-06 NOTE — ED Notes (Signed)
Pt in c/o raised pink rashes to backs of arms, elbows, and legs. Denies any new products or clothes.

## 2017-01-19 IMAGING — CR DG HIP (WITH OR WITHOUT PELVIS) 2-3V*L*
3 series · 3 of 3 positions shown · non-contrast
Comparison: 09/19/2012

CLINICAL DATA: Fall, acute left hip pain

EXAM:
LEFT HIP (WITH PELVIS) 2-3 VIEWS

[t pelvis a.p.]
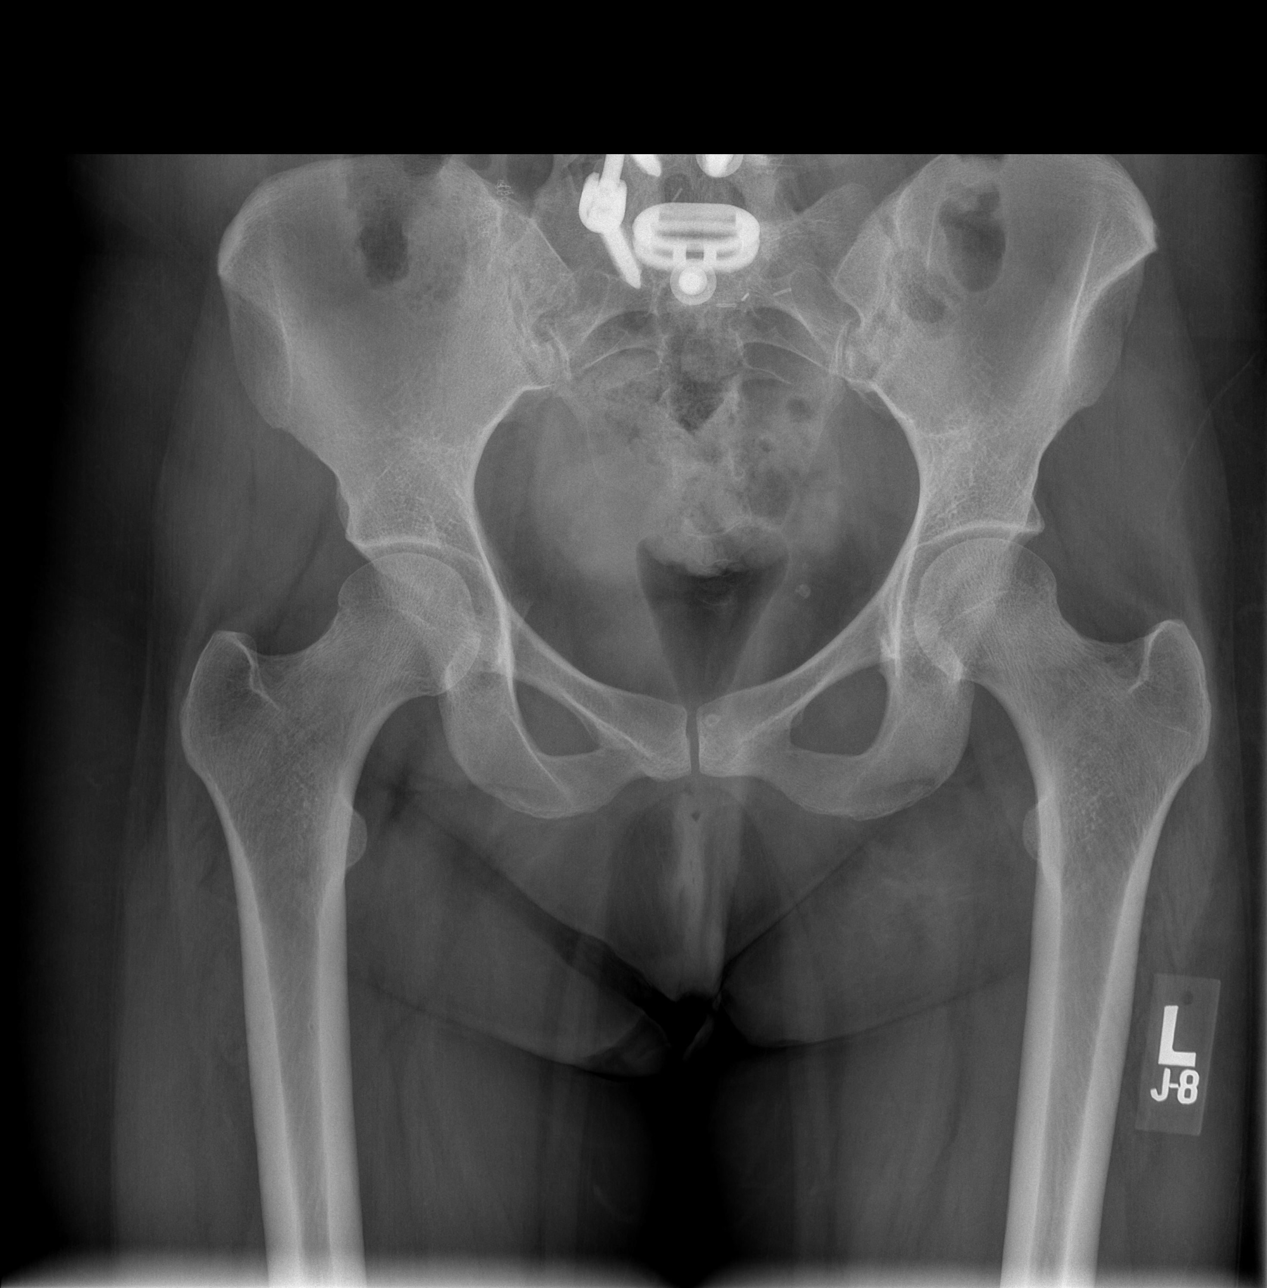

[t hip ap left]
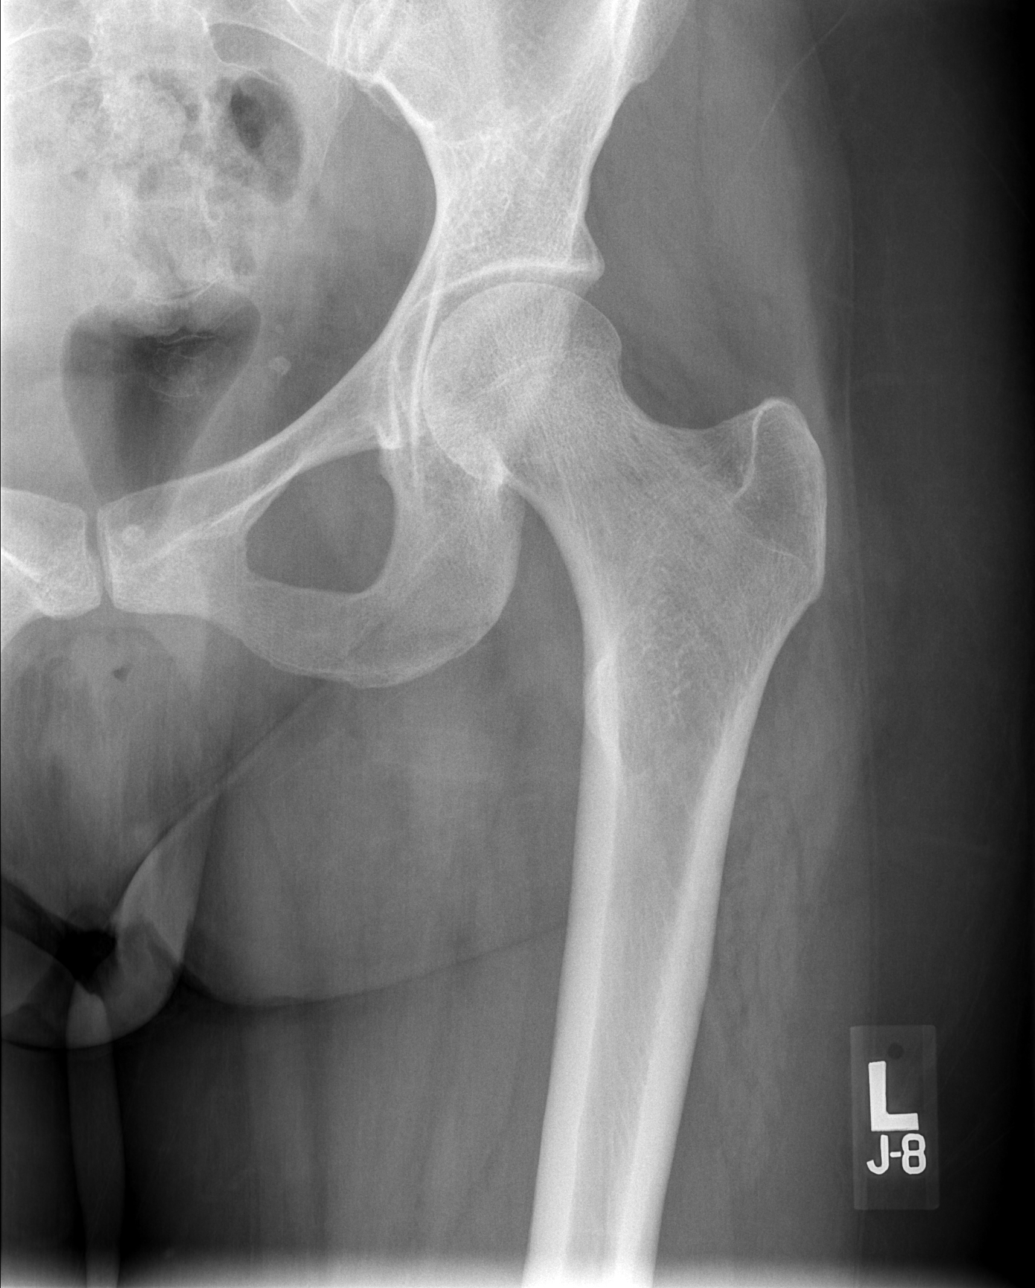

[t hip frog leg left]
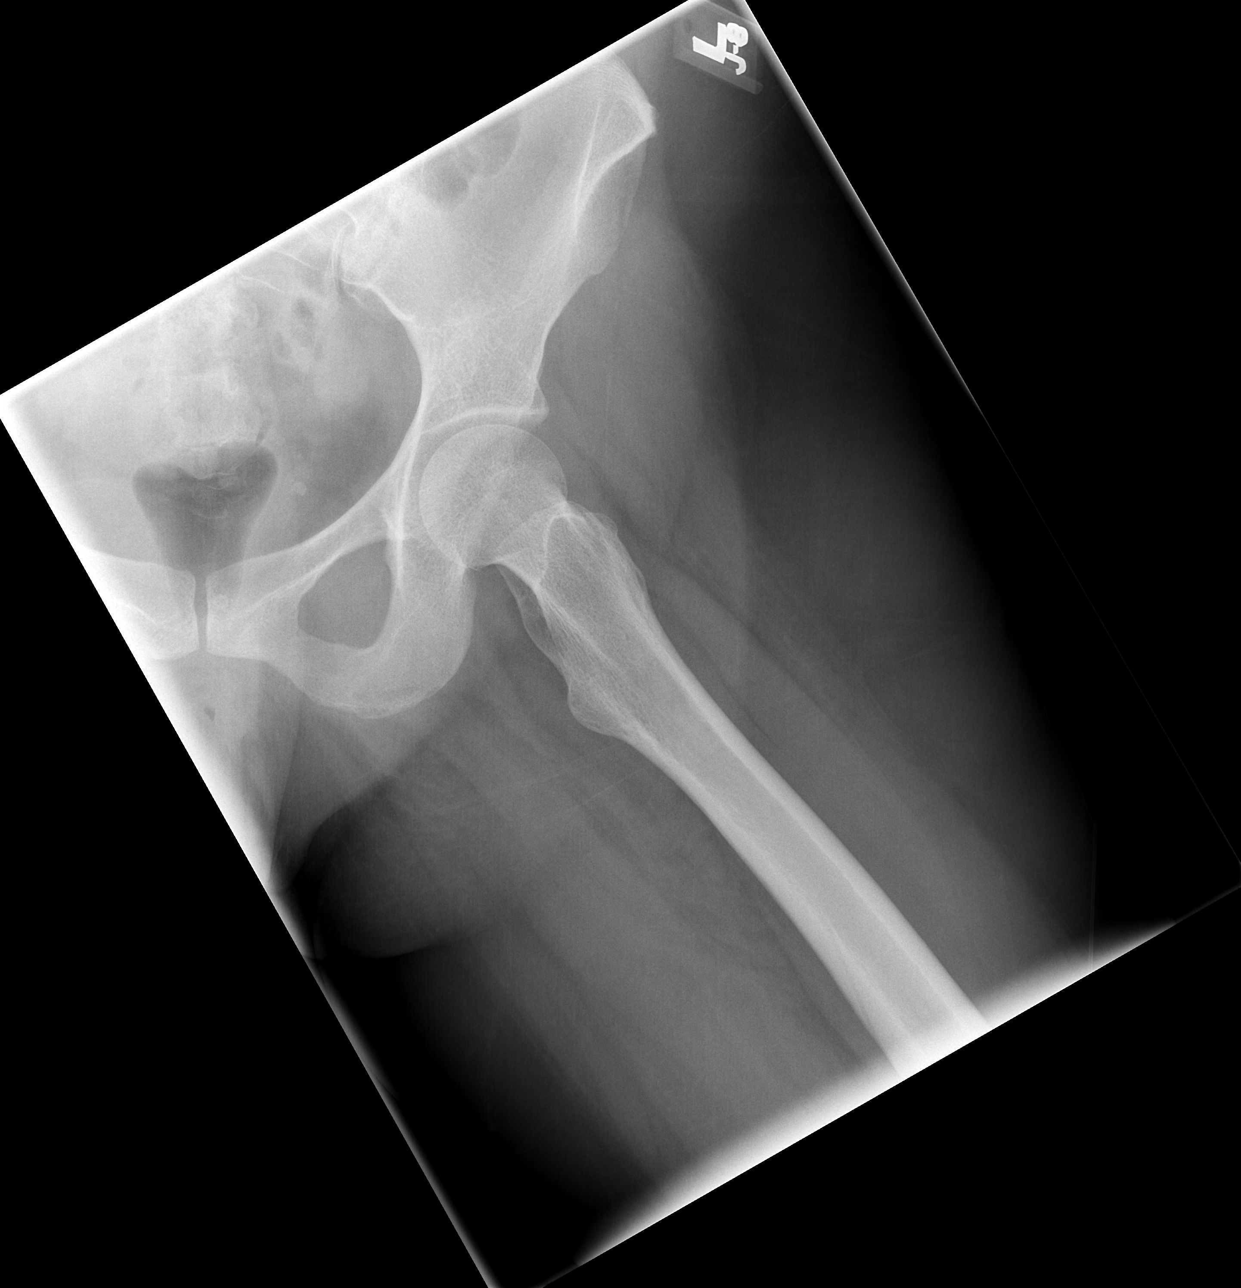

[3 of 3 positions shown; findings below may reference images not displayed]

FINDINGS: Postop changes of the lower lumbar spine at L4-S1 with anterior and
posterior fusion.

Left hip appears intact. Negative for fracture or malalignment. Hips
appear symmetric. Pelvis intact. Stable appearing SI joints. No
diastases. Normal bowel gas pattern.
IMPRESSION: No acute osseous finding.

## 2017-01-19 IMAGING — CR DG LUMBAR SPINE COMPLETE 4+V
5 series · 5 of 5 positions shown · non-contrast
Comparison: None.

CLINICAL DATA: Fell on a wet floor today and injured back.

EXAM:
THORACIC SPINE - 2 VIEW + SWIMMERS; LUMBAR SPINE - COMPLETE 4+ VIEW

[t l-spine a.p.]
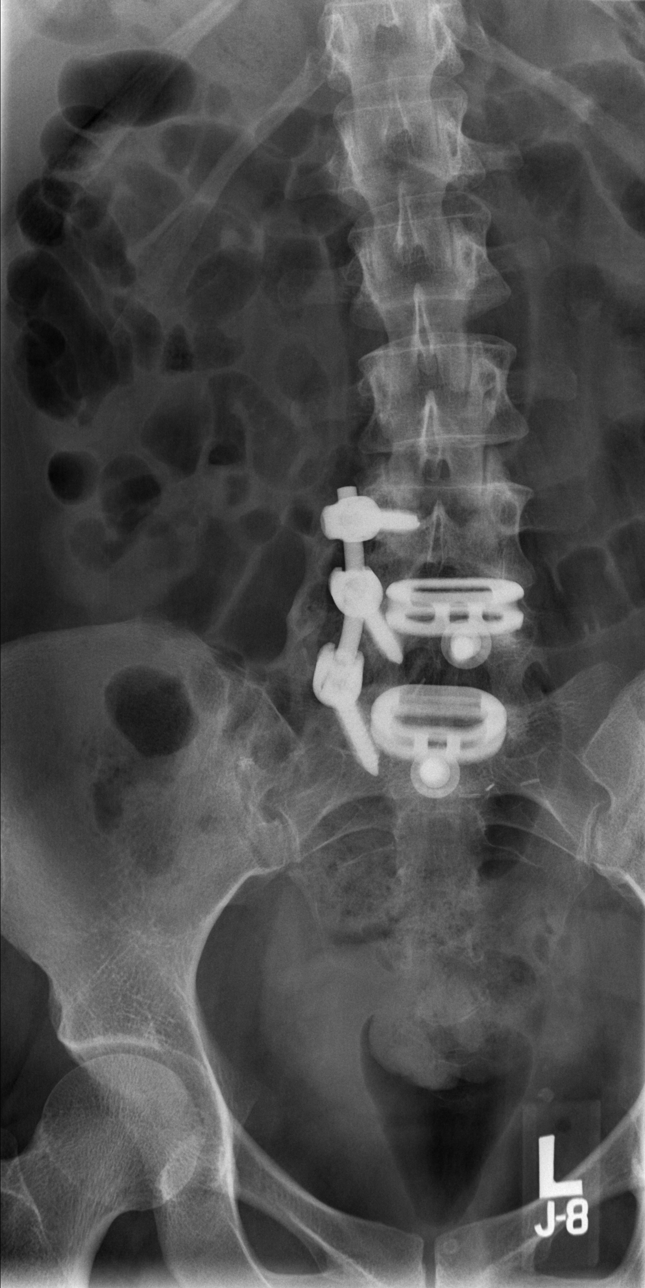

[t l-spine oblique exposure (1 of 2)]
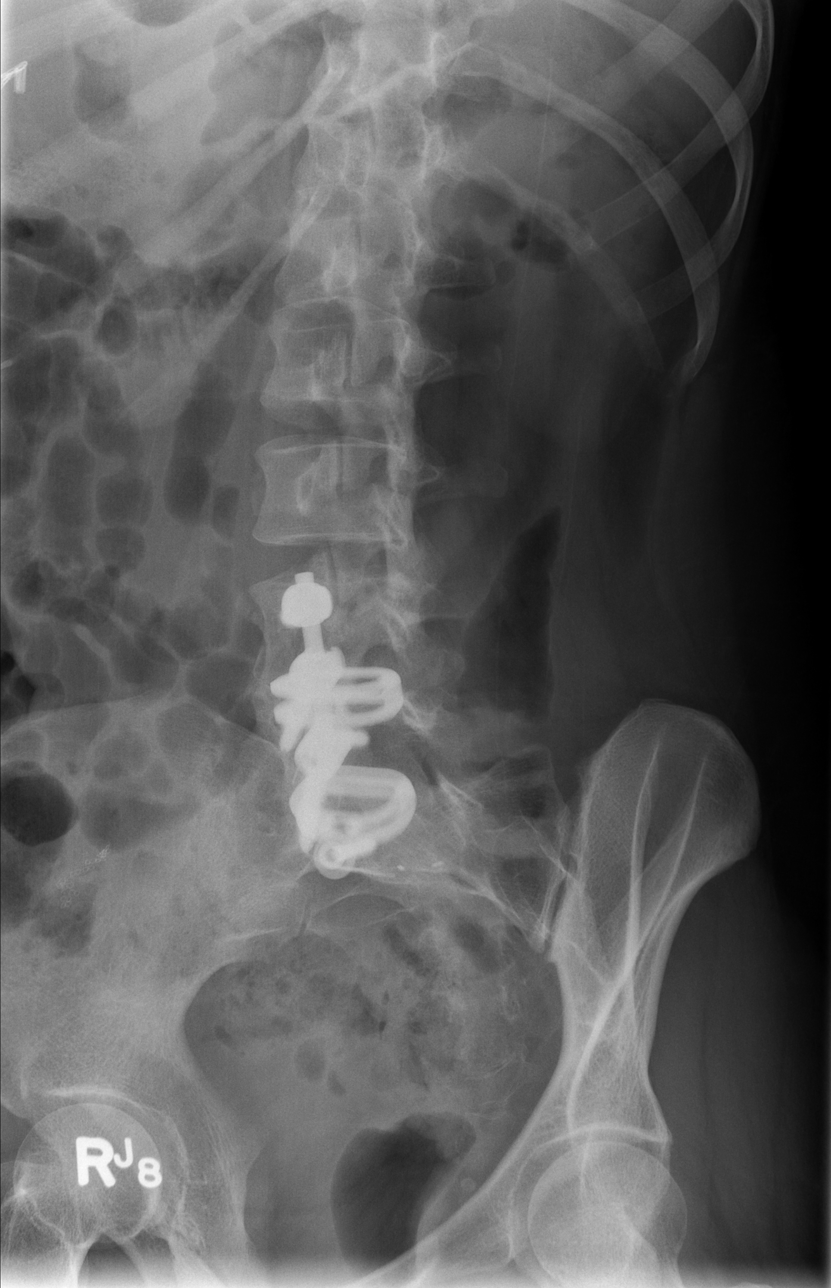

[t l-spine oblique exposure (2 of 2)]
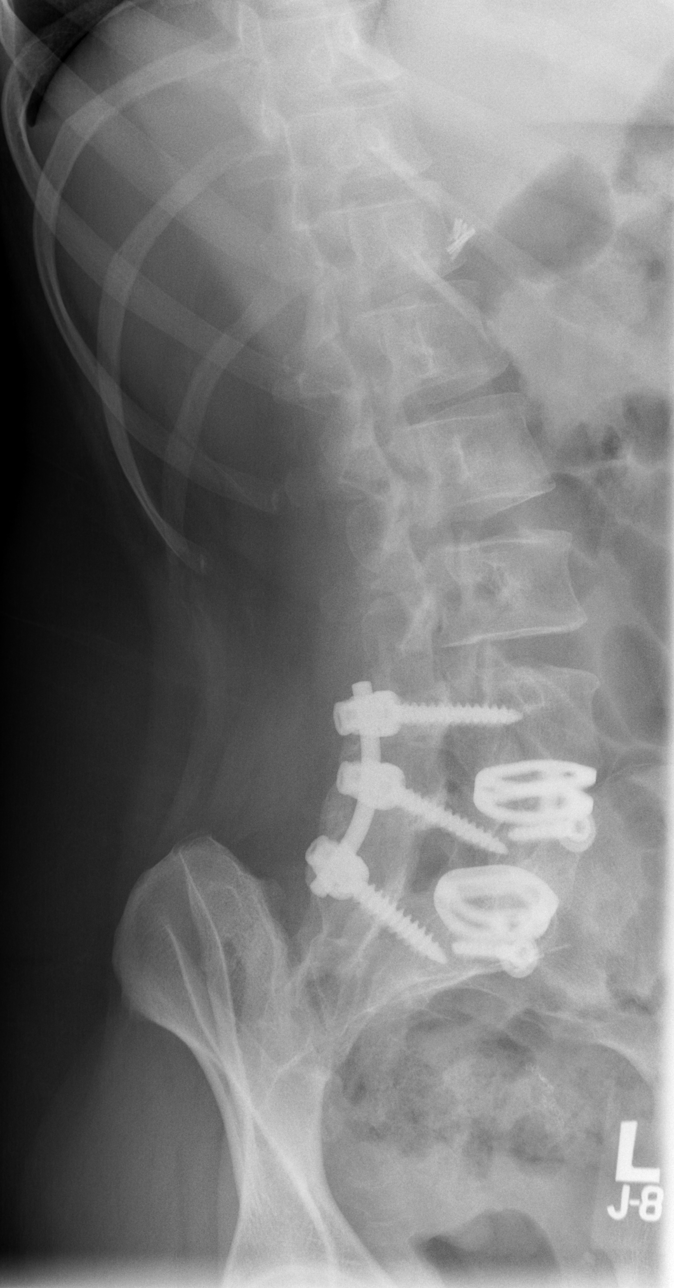

[t l-spine lat]
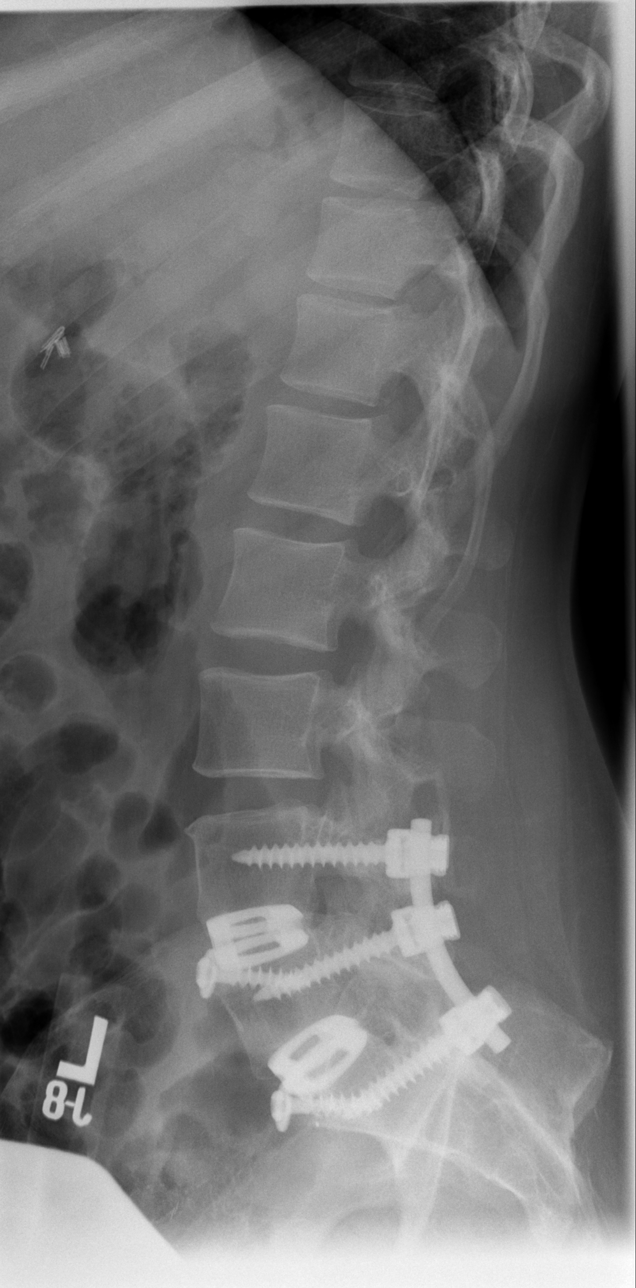

[t l-spine l5-s1 spot]
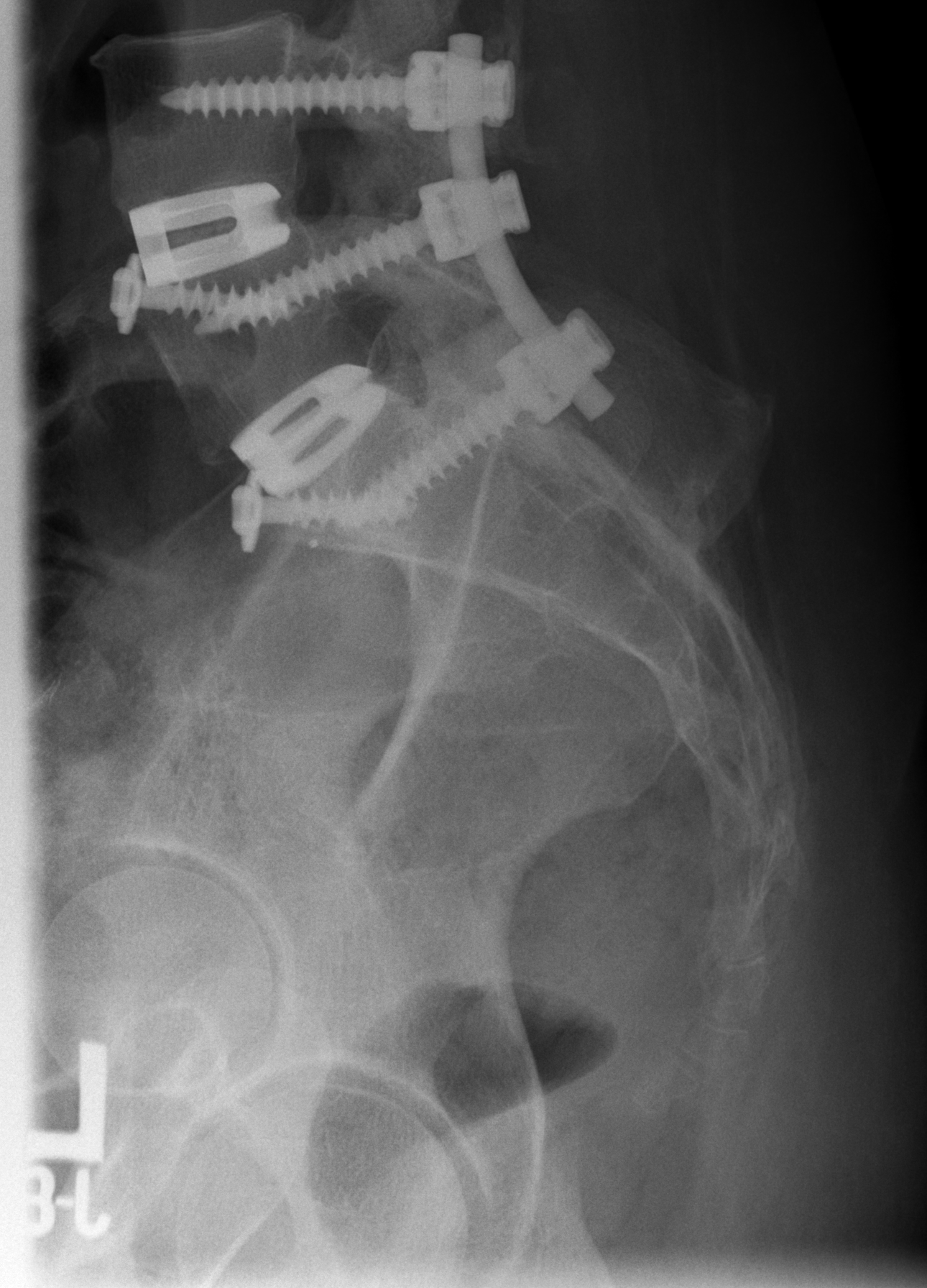

[5 of 5 positions shown; findings below may reference images not displayed]

FINDINGS: Thoracic spine:

Normal alignment of the thoracic vertebral bodies. Disc spaces and
vertebral bodies are maintained. No acute compression fracture. No
abnormal paraspinal soft tissue thickening. The visualized posterior
ribs are intact.

Lumbar spine:

Mild left convex lumbar scoliosis. There are right-sided pedicle
screws and a posterior rod along with interbody fusion devices at
L4-5 and L5-S1. No complicating features are demonstrated. Normal
alignment in the lateral plane. Disc spaces are maintained. No acute
bony findings. The visualized bony pelvis is intact. The SI joints
appear normal.
IMPRESSION: 1. Normal thoracic spine series.
2. L4-5 and L5-S1 fusion changes without complicating features.
3. No acute bony findings involving the lumbar spine.
